# Patient Record
Sex: Female | Born: 1960 | Race: White | Hispanic: No | Marital: Single | Smoking: Never smoker
Health system: Southern US, Community
[De-identification: ages and names within clinical notes are randomized; demographics above are authoritative.]

## PROBLEM LIST (undated history)

## (undated) DIAGNOSIS — R519 Headache, unspecified: Secondary | ICD-10-CM

## (undated) DIAGNOSIS — A6000 Herpesviral infection of urogenital system, unspecified: Secondary | ICD-10-CM

## (undated) DIAGNOSIS — G2581 Restless legs syndrome: Secondary | ICD-10-CM

## (undated) DIAGNOSIS — B0089 Other herpesviral infection: Secondary | ICD-10-CM

## (undated) DIAGNOSIS — F329 Major depressive disorder, single episode, unspecified: Secondary | ICD-10-CM

## (undated) DIAGNOSIS — F32A Depression, unspecified: Secondary | ICD-10-CM

## (undated) DIAGNOSIS — I10 Essential (primary) hypertension: Secondary | ICD-10-CM

## (undated) DIAGNOSIS — N6019 Diffuse cystic mastopathy of unspecified breast: Secondary | ICD-10-CM

## (undated) DIAGNOSIS — M542 Cervicalgia: Secondary | ICD-10-CM

## (undated) DIAGNOSIS — N63 Unspecified lump in unspecified breast: Secondary | ICD-10-CM

## (undated) DIAGNOSIS — E785 Hyperlipidemia, unspecified: Secondary | ICD-10-CM

## (undated) DIAGNOSIS — G35 Multiple sclerosis: Secondary | ICD-10-CM

## (undated) DIAGNOSIS — I809 Phlebitis and thrombophlebitis of unspecified site: Secondary | ICD-10-CM

## (undated) DIAGNOSIS — J45909 Unspecified asthma, uncomplicated: Secondary | ICD-10-CM

## (undated) DIAGNOSIS — R51 Headache: Secondary | ICD-10-CM

## (undated) DIAGNOSIS — D649 Anemia, unspecified: Secondary | ICD-10-CM

## (undated) DIAGNOSIS — I2699 Other pulmonary embolism without acute cor pulmonale: Secondary | ICD-10-CM

## (undated) HISTORY — PX: REDUCTION MAMMAPLASTY: SUR839

---

## 2005-12-13 ENCOUNTER — Emergency Department: Payer: Self-pay | Admitting: Emergency Medicine

## 2006-04-24 ENCOUNTER — Emergency Department: Payer: Self-pay | Admitting: Emergency Medicine

## 2006-06-11 ENCOUNTER — Ambulatory Visit: Payer: Self-pay | Admitting: Family Medicine

## 2006-08-25 ENCOUNTER — Ambulatory Visit: Payer: Self-pay

## 2009-05-21 ENCOUNTER — Ambulatory Visit: Payer: Self-pay | Admitting: Family Medicine

## 2009-06-16 ENCOUNTER — Inpatient Hospital Stay: Payer: Self-pay | Admitting: *Deleted

## 2009-11-29 ENCOUNTER — Emergency Department: Payer: Self-pay | Admitting: Emergency Medicine

## 2010-01-14 ENCOUNTER — Ambulatory Visit: Payer: Self-pay | Admitting: Oncology

## 2010-01-26 ENCOUNTER — Inpatient Hospital Stay: Payer: Self-pay | Admitting: Internal Medicine

## 2010-01-26 ENCOUNTER — Ambulatory Visit: Payer: Self-pay | Admitting: Cardiovascular Disease

## 2010-02-13 ENCOUNTER — Ambulatory Visit: Payer: Self-pay | Admitting: Oncology

## 2010-03-05 ENCOUNTER — Ambulatory Visit: Payer: Self-pay | Admitting: Oncology

## 2010-03-16 ENCOUNTER — Ambulatory Visit: Payer: Self-pay | Admitting: Oncology

## 2010-03-20 ENCOUNTER — Ambulatory Visit: Payer: Self-pay | Admitting: Specialist

## 2010-04-15 ENCOUNTER — Ambulatory Visit: Payer: Self-pay | Admitting: Pain Medicine

## 2010-04-22 ENCOUNTER — Ambulatory Visit: Payer: Self-pay | Admitting: Pain Medicine

## 2010-04-29 ENCOUNTER — Inpatient Hospital Stay: Payer: Self-pay | Admitting: Internal Medicine

## 2010-05-12 ENCOUNTER — Ambulatory Visit: Payer: Self-pay | Admitting: Neurology

## 2010-05-18 ENCOUNTER — Ambulatory Visit: Payer: Self-pay | Admitting: Pain Medicine

## 2010-05-28 ENCOUNTER — Ambulatory Visit: Payer: Self-pay | Admitting: Pain Medicine

## 2010-06-15 ENCOUNTER — Ambulatory Visit: Payer: Self-pay | Admitting: Pain Medicine

## 2010-06-16 ENCOUNTER — Encounter: Payer: Self-pay | Admitting: Pain Medicine

## 2010-06-17 ENCOUNTER — Ambulatory Visit: Payer: Self-pay | Admitting: Pain Medicine

## 2010-06-24 ENCOUNTER — Ambulatory Visit: Payer: Self-pay | Admitting: Pain Medicine

## 2010-07-16 ENCOUNTER — Ambulatory Visit: Payer: Self-pay | Admitting: Pain Medicine

## 2010-07-16 ENCOUNTER — Inpatient Hospital Stay: Payer: Self-pay | Admitting: Internal Medicine

## 2010-07-16 ENCOUNTER — Ambulatory Visit: Payer: Self-pay | Admitting: Internal Medicine

## 2010-07-20 LAB — CEA: CEA: 1.3 ng/mL (ref 0.0–4.7)

## 2010-07-23 ENCOUNTER — Encounter: Payer: Self-pay | Admitting: Pain Medicine

## 2010-07-27 ENCOUNTER — Ambulatory Visit: Payer: Self-pay | Admitting: Pain Medicine

## 2010-08-12 ENCOUNTER — Ambulatory Visit: Payer: Self-pay | Admitting: Family Medicine

## 2010-08-16 ENCOUNTER — Ambulatory Visit: Payer: Self-pay | Admitting: Internal Medicine

## 2010-08-18 ENCOUNTER — Ambulatory Visit: Payer: Self-pay | Admitting: Pain Medicine

## 2010-08-26 ENCOUNTER — Ambulatory Visit: Payer: Self-pay | Admitting: Pain Medicine

## 2010-09-26 ENCOUNTER — Emergency Department: Payer: Self-pay | Admitting: Unknown Physician Specialty

## 2010-09-29 ENCOUNTER — Ambulatory Visit: Payer: Self-pay | Admitting: Pain Medicine

## 2010-10-07 ENCOUNTER — Ambulatory Visit: Payer: Self-pay | Admitting: Pain Medicine

## 2010-10-26 ENCOUNTER — Ambulatory Visit: Payer: Self-pay | Admitting: Pain Medicine

## 2010-11-26 ENCOUNTER — Ambulatory Visit: Payer: Self-pay | Admitting: Pain Medicine

## 2010-11-30 ENCOUNTER — Ambulatory Visit: Payer: Self-pay | Admitting: Pain Medicine

## 2010-12-31 ENCOUNTER — Ambulatory Visit: Payer: Self-pay | Admitting: Pain Medicine

## 2011-01-14 ENCOUNTER — Ambulatory Visit: Payer: Self-pay | Admitting: Pain Medicine

## 2011-01-19 ENCOUNTER — Ambulatory Visit: Payer: Self-pay | Admitting: Pain Medicine

## 2011-01-20 ENCOUNTER — Ambulatory Visit: Payer: Self-pay | Admitting: Pain Medicine

## 2011-02-02 ENCOUNTER — Ambulatory Visit: Payer: Self-pay | Admitting: Pain Medicine

## 2011-02-08 ENCOUNTER — Ambulatory Visit: Payer: Self-pay | Admitting: Pain Medicine

## 2011-03-11 ENCOUNTER — Ambulatory Visit: Payer: Self-pay | Admitting: Pain Medicine

## 2011-03-29 ENCOUNTER — Ambulatory Visit: Payer: Self-pay | Admitting: Pain Medicine

## 2011-04-13 ENCOUNTER — Ambulatory Visit: Payer: Self-pay | Admitting: Pain Medicine

## 2011-04-14 ENCOUNTER — Ambulatory Visit: Payer: Self-pay | Admitting: Pain Medicine

## 2011-05-13 ENCOUNTER — Ambulatory Visit: Payer: Self-pay | Admitting: Pain Medicine

## 2011-05-19 ENCOUNTER — Ambulatory Visit: Payer: Self-pay | Admitting: Pain Medicine

## 2011-06-10 ENCOUNTER — Ambulatory Visit: Payer: Self-pay | Admitting: Pain Medicine

## 2011-06-16 ENCOUNTER — Ambulatory Visit: Payer: Self-pay | Admitting: Pain Medicine

## 2011-07-15 ENCOUNTER — Ambulatory Visit: Payer: Self-pay | Admitting: Pain Medicine

## 2011-07-26 ENCOUNTER — Ambulatory Visit: Payer: Self-pay | Admitting: Pain Medicine

## 2011-09-09 ENCOUNTER — Ambulatory Visit: Payer: Self-pay | Admitting: Pain Medicine

## 2011-09-14 ENCOUNTER — Ambulatory Visit: Payer: Self-pay | Admitting: Family Medicine

## 2011-09-15 ENCOUNTER — Ambulatory Visit: Payer: Self-pay | Admitting: Pain Medicine

## 2011-09-16 ENCOUNTER — Ambulatory Visit: Payer: Self-pay | Admitting: Family Medicine

## 2011-10-07 ENCOUNTER — Ambulatory Visit: Payer: Self-pay | Admitting: Pain Medicine

## 2011-10-11 ENCOUNTER — Ambulatory Visit: Payer: Self-pay | Admitting: Pain Medicine

## 2011-11-04 ENCOUNTER — Ambulatory Visit: Payer: Self-pay | Admitting: Pain Medicine

## 2011-11-17 ENCOUNTER — Ambulatory Visit: Payer: Self-pay | Admitting: Pain Medicine

## 2011-12-07 ENCOUNTER — Ambulatory Visit: Payer: Self-pay | Admitting: Pain Medicine

## 2011-12-13 ENCOUNTER — Ambulatory Visit: Payer: Self-pay | Admitting: Pain Medicine

## 2012-01-04 ENCOUNTER — Ambulatory Visit: Payer: Self-pay | Admitting: Pain Medicine

## 2012-01-12 ENCOUNTER — Ambulatory Visit: Payer: Self-pay | Admitting: Pain Medicine

## 2012-02-03 ENCOUNTER — Ambulatory Visit: Payer: Self-pay | Admitting: Pain Medicine

## 2012-02-07 ENCOUNTER — Ambulatory Visit: Payer: Self-pay | Admitting: Gastroenterology

## 2012-02-08 ENCOUNTER — Ambulatory Visit: Payer: Self-pay | Admitting: Gastroenterology

## 2012-02-14 ENCOUNTER — Ambulatory Visit: Payer: Self-pay | Admitting: Pain Medicine

## 2012-02-16 ENCOUNTER — Ambulatory Visit: Payer: Self-pay | Admitting: Pain Medicine

## 2012-03-07 ENCOUNTER — Ambulatory Visit: Payer: Self-pay | Admitting: Pain Medicine

## 2012-03-20 ENCOUNTER — Ambulatory Visit: Payer: Self-pay | Admitting: Pain Medicine

## 2012-04-06 ENCOUNTER — Ambulatory Visit: Payer: Self-pay | Admitting: Pain Medicine

## 2012-04-07 ENCOUNTER — Emergency Department: Payer: Self-pay | Admitting: Emergency Medicine

## 2012-04-07 LAB — COMPREHENSIVE METABOLIC PANEL
Alkaline Phosphatase: 148 U/L — ABNORMAL HIGH (ref 50–136)
Anion Gap: 11 (ref 7–16)
BUN: 11 mg/dL (ref 7–18)
Chloride: 100 mmol/L (ref 98–107)
Creatinine: 1.08 mg/dL (ref 0.60–1.30)
EGFR (African American): >60
Glucose: 124 mg/dL — ABNORMAL HIGH (ref 65–99)
SGOT(AST): 43 U/L — ABNORMAL HIGH (ref 15–37)
SGPT (ALT): 31 U/L (ref 12–78)
Total Protein: 7.2 g/dL (ref 6.4–8.2)

## 2012-04-07 LAB — CBC WITH DIFFERENTIAL/PLATELET
Eosinophil #: 0.1 10*3/uL (ref 0.0–0.7)
Lymphocyte %: 39.9 %
MCH: 31.8 pg (ref 26.0–34.0)
MCHC: 34.5 g/dL (ref 32.0–36.0)
MCV: 92 fL (ref 80–100)
Monocyte #: 0.5 x10 3/mm (ref 0.2–0.9)
Neutrophil #: 2.5 10*3/uL (ref 1.4–6.5)
Neutrophil %: 47.1 %
Platelet: 366 10*3/uL (ref 150–440)

## 2012-04-07 LAB — APTT: Activated PTT: 42 secs — ABNORMAL HIGH (ref 23.6–35.9)

## 2012-04-07 LAB — PROTIME-INR: Prothrombin Time: 16.6 secs — ABNORMAL HIGH (ref 11.5–14.7)

## 2012-04-07 LAB — HEMOGLOBIN: HGB: 7.7 g/dL — ABNORMAL LOW (ref 12.0–16.0)

## 2012-04-26 ENCOUNTER — Ambulatory Visit: Payer: Self-pay | Admitting: Pain Medicine

## 2012-05-30 ENCOUNTER — Ambulatory Visit: Payer: Self-pay | Admitting: Pain Medicine

## 2012-05-30 ENCOUNTER — Other Ambulatory Visit: Payer: Self-pay | Admitting: Sports Medicine

## 2012-05-30 LAB — BODY FLUID CELL COUNT WITH DIFFERENTIAL
Basophil: 0 %
Eosinophil: 2 %
Lymphocytes: 26 %
Neutrophils: 17 %
Nucleated Cell Count: 778 /mm3
Other Cells BF: 0 %
Other Mononuclear Cells: 55 %

## 2012-05-30 LAB — SYNOVIAL FLUID, CRYSTAL: Crystals, Joint Fluid: NONE SEEN

## 2012-06-03 LAB — BODY FLUID CULTURE

## 2012-06-07 ENCOUNTER — Ambulatory Visit: Payer: Self-pay | Admitting: Pain Medicine

## 2012-07-06 ENCOUNTER — Ambulatory Visit: Payer: Self-pay | Admitting: Pain Medicine

## 2012-07-17 ENCOUNTER — Ambulatory Visit: Payer: Self-pay | Admitting: Pain Medicine

## 2012-07-25 ENCOUNTER — Emergency Department: Payer: Self-pay | Admitting: Emergency Medicine

## 2012-08-03 ENCOUNTER — Ambulatory Visit: Payer: Self-pay | Admitting: Pain Medicine

## 2012-08-21 ENCOUNTER — Ambulatory Visit: Payer: Self-pay | Admitting: Pain Medicine

## 2012-08-31 ENCOUNTER — Ambulatory Visit: Payer: Self-pay | Admitting: Pain Medicine

## 2012-09-11 ENCOUNTER — Ambulatory Visit: Payer: Self-pay | Admitting: Pain Medicine

## 2012-09-14 ENCOUNTER — Ambulatory Visit: Payer: Self-pay | Admitting: Obstetrics and Gynecology

## 2012-10-03 ENCOUNTER — Ambulatory Visit: Payer: Self-pay | Admitting: Pain Medicine

## 2012-10-09 ENCOUNTER — Ambulatory Visit: Payer: Self-pay | Admitting: Pain Medicine

## 2012-10-18 ENCOUNTER — Ambulatory Visit: Payer: Self-pay | Admitting: Pain Medicine

## 2012-10-31 ENCOUNTER — Ambulatory Visit: Payer: Self-pay | Admitting: Pain Medicine

## 2012-11-08 ENCOUNTER — Ambulatory Visit: Payer: Self-pay | Admitting: Pain Medicine

## 2012-11-22 ENCOUNTER — Ambulatory Visit: Payer: Self-pay | Admitting: Pain Medicine

## 2012-12-06 ENCOUNTER — Ambulatory Visit: Payer: Self-pay | Admitting: Pain Medicine

## 2012-12-28 ENCOUNTER — Ambulatory Visit: Payer: Self-pay | Admitting: Pain Medicine

## 2012-12-30 ENCOUNTER — Ambulatory Visit: Payer: Self-pay | Admitting: Neurology

## 2012-12-30 LAB — CREATININE, SERUM
Creatinine: 1.16 mg/dL (ref 0.60–1.30)
EGFR (African American): >60
EGFR (Non-African Amer.): 54 — ABNORMAL LOW

## 2013-01-10 ENCOUNTER — Ambulatory Visit: Payer: Self-pay | Admitting: Neurology

## 2013-01-10 ENCOUNTER — Inpatient Hospital Stay: Payer: Self-pay | Admitting: Family Medicine

## 2013-01-10 ENCOUNTER — Ambulatory Visit: Payer: Self-pay | Admitting: Pain Medicine

## 2013-01-10 LAB — BASIC METABOLIC PANEL
Calcium, Total: 9.2 mg/dL (ref 8.5–10.1)
Creatinine: 1.05 mg/dL (ref 0.60–1.30)
EGFR (Non-African Amer.): >60
Osmolality: 274 (ref 275–301)
Potassium: 4 mmol/L (ref 3.5–5.1)

## 2013-01-10 LAB — URINALYSIS, COMPLETE
Bacteria: NONE SEEN
Bilirubin,UR: NEGATIVE
Ketone: NEGATIVE
Nitrite: NEGATIVE
Ph: 5 (ref 4.5–8.0)
Protein: NEGATIVE
RBC,UR: 5 /HPF (ref 0–5)
Specific Gravity: 1.018 (ref 1.003–1.030)

## 2013-01-10 LAB — CBC
HCT: 40.1 % (ref 35.0–47.0)
HGB: 13.8 g/dL (ref 12.0–16.0)
MCH: 32.8 pg (ref 26.0–34.0)
MCV: 95 fL (ref 80–100)
Platelet: 246 10*3/uL (ref 150–440)
RBC: 4.21 10*6/uL (ref 3.80–5.20)
RDW: 13.7 % (ref 11.5–14.5)
WBC: 6.8 10*3/uL (ref 3.6–11.0)

## 2013-01-10 LAB — PROTIME-INR: INR: 2.9

## 2013-01-24 ENCOUNTER — Ambulatory Visit: Payer: Self-pay | Admitting: Pain Medicine

## 2013-02-05 ENCOUNTER — Ambulatory Visit: Payer: Self-pay | Admitting: Pain Medicine

## 2013-02-15 ENCOUNTER — Ambulatory Visit: Payer: Self-pay | Admitting: Pain Medicine

## 2013-02-21 ENCOUNTER — Ambulatory Visit: Payer: Self-pay | Admitting: Pain Medicine

## 2013-03-29 ENCOUNTER — Ambulatory Visit: Payer: Self-pay | Admitting: Pain Medicine

## 2013-04-06 ENCOUNTER — Emergency Department: Payer: Self-pay | Admitting: Emergency Medicine

## 2013-04-06 LAB — URINALYSIS, COMPLETE
Bilirubin,UR: NEGATIVE
Blood: NEGATIVE
Glucose,UR: NEGATIVE mg/dL (ref 0–75)
Nitrite: NEGATIVE
Specific Gravity: 1.02 (ref 1.003–1.030)
Squamous Epithelial: 16
WBC UR: 12 /HPF (ref 0–5)

## 2013-04-06 LAB — CBC
HCT: 45.5 % (ref 35.0–47.0)
MCH: 33.4 pg (ref 26.0–34.0)
MCV: 97 fL (ref 80–100)
Platelet: 243 10*3/uL (ref 150–440)
RDW: 13.8 % (ref 11.5–14.5)

## 2013-04-06 LAB — BASIC METABOLIC PANEL
Anion Gap: 4 — ABNORMAL LOW (ref 7–16)
Chloride: 103 mmol/L (ref 98–107)
Co2: 30 mmol/L (ref 21–32)
Creatinine: 1.11 mg/dL (ref 0.60–1.30)
EGFR (African American): >60
Osmolality: 274 (ref 275–301)
Potassium: 3.5 mmol/L (ref 3.5–5.1)

## 2013-04-06 LAB — TROPONIN I: Troponin-I: <0.02 ng/mL

## 2013-04-11 ENCOUNTER — Emergency Department: Payer: Self-pay | Admitting: Internal Medicine

## 2013-04-18 ENCOUNTER — Ambulatory Visit: Payer: Self-pay | Admitting: Pain Medicine

## 2013-04-30 ENCOUNTER — Ambulatory Visit: Payer: Self-pay | Admitting: Pain Medicine

## 2013-05-29 ENCOUNTER — Ambulatory Visit: Payer: Self-pay | Admitting: Pain Medicine

## 2013-06-29 ENCOUNTER — Emergency Department: Payer: Self-pay | Admitting: Emergency Medicine

## 2013-06-29 LAB — CBC
HCT: 37.8 % (ref 35.0–47.0)
HGB: 12.8 g/dL (ref 12.0–16.0)
MCHC: 34 g/dL (ref 32.0–36.0)
MCV: 95 fL (ref 80–100)
RBC: 3.96 10*6/uL (ref 3.80–5.20)
WBC: 8.8 10*3/uL (ref 3.6–11.0)

## 2013-06-29 LAB — COMPREHENSIVE METABOLIC PANEL
Albumin: 3.7 g/dL (ref 3.4–5.0)
Anion Gap: 3 — ABNORMAL LOW (ref 7–16)
BUN: 9 mg/dL (ref 7–18)
Bilirubin,Total: 0.4 mg/dL (ref 0.2–1.0)
Calcium, Total: 9 mg/dL (ref 8.5–10.1)
Co2: 30 mmol/L (ref 21–32)
Glucose: 83 mg/dL (ref 65–99)
Osmolality: 275 (ref 275–301)
Potassium: 4.1 mmol/L (ref 3.5–5.1)
SGOT(AST): 44 U/L — ABNORMAL HIGH (ref 15–37)
Total Protein: 6.7 g/dL (ref 6.4–8.2)

## 2013-06-29 LAB — URINALYSIS, COMPLETE
Bilirubin,UR: NEGATIVE
Blood: NEGATIVE
Glucose,UR: NEGATIVE mg/dL (ref 0–75)
Ketone: NEGATIVE
Nitrite: NEGATIVE
Ph: 5 (ref 4.5–8.0)

## 2013-06-29 LAB — PROTIME-INR: INR: 1.6

## 2013-07-31 ENCOUNTER — Ambulatory Visit: Payer: Self-pay | Admitting: Psychiatry

## 2013-10-30 ENCOUNTER — Ambulatory Visit: Payer: Self-pay | Admitting: Obstetrics and Gynecology

## 2014-01-08 ENCOUNTER — Ambulatory Visit: Payer: Self-pay | Admitting: Pain Medicine

## 2014-01-13 ENCOUNTER — Emergency Department: Payer: Self-pay | Admitting: Emergency Medicine

## 2014-02-18 ENCOUNTER — Encounter: Payer: Self-pay | Admitting: Anesthesiology

## 2014-03-16 ENCOUNTER — Encounter: Payer: Self-pay | Admitting: Anesthesiology

## 2014-11-01 ENCOUNTER — Ambulatory Visit: Payer: Self-pay | Admitting: Obstetrics and Gynecology

## 2014-11-29 ENCOUNTER — Other Ambulatory Visit: Payer: Self-pay | Admitting: Obstetrics and Gynecology

## 2014-11-29 DIAGNOSIS — N63 Unspecified lump in unspecified breast: Secondary | ICD-10-CM

## 2014-12-03 ENCOUNTER — Ambulatory Visit
Admit: 2014-12-03 | Disposition: A | Discharge status: Home / Self Care | Payer: Self-pay | Attending: Otolaryngology | Admitting: Otolaryngology

## 2014-12-04 ENCOUNTER — Emergency Department: Admit: 2014-12-04 | Discharge status: Against Medical Advice | Payer: Self-pay | Admitting: Student

## 2014-12-06 NOTE — H&P (Signed)
PATIENT NAME:  Bailey Graham MR#:  [id number] DATE OF BIRTH:  10-05-1960  DATE OF ADMISSION:  01/10/2013 DATE OF DISCHARGE: 01/10/2013   NOTE: This is both a history and physical as well as discharge summary, as the patient is being admitted and discharged the same day.  HISTORY AND PHYSICAL  CHIEF COMPLAINT: Status post fall and weakness.   HISTORY OF PRESENT ILLNESS: Ms. Bailey Graham is a very nice 54 year old female who has history of being her normal self up until yesterday at night. Around 8:00 p.m., she got out of bed, going to the kitchen to find something to eat and fell. She states that she just felt her legs give out, left more than right, and she fell down and hit her head on the back of her occiput. Her son heard a noise and he came rapidly and found her on the floor. He picked her up and put her in bed. He wanted to bring her to the ER, but the patient refused to come to the ER and decided to wait until this morning. This morning, the patient went to the pain clinic. The pain clinic called Dr. Sherryll Graham. Dr. Sherryll Graham said bring her to the ER because she had a exacerbation of her MS and he wanted to get it treated with IV steroids for 3 days. From the ER, we called Dr. Sherryll Graham and he went to do the steroids IV as mentioned above. The patient has a history of falls that she has been having at least twice a week. The patient has been evaluated by neurology for this and she actually had an MRI done last Saturday, the 17th, outpatient and it did not show any major abnormalities. There are white matter signal abnormalities that reflect demyelination process, sequelae of small vessel ischemia, no change since prior MRI. The patient is admitted for treatment of this possible exacerbation of MS.   REVIEW OF SYSTEMS: CONSTITUTIONAL: The patient states that she has chronic fatigue, and she has what she called exacerbations of her MS on a weekly basis. She has not been in the hospital for this in a long  time.  EYES: No blurry vision, double vision or inflammation.  EARS, NOSE, THROAT: No tinnitus. No difficulty swallowing. No sinus pain. RESPIRATORY:  The patient states she does not have any shortness of breath, hemoptysis, wheezing or painful respirations.  CARDIOVASCULAR: Denies any significant chest pain, orthopnea, syncope or palpitations.  GASTROINTESTINAL: No nausea, vomiting, abdominal pain, rectal bleeding, constipation or diarrhea.  GENITOURINARY: No dysuria, hematuria or incontinence.  GYNECOLOGIC: No breast masses.  HEMATOLOGIC AND LYMPHATIC: No anemia. Positive easy bruising, as the patient is on Coumadin. No significant bleeding. No swollen glands. The patient has factor V Leiden mutation.  ENDOCRINE: No polyuria, polydipsia or polyphagia. No cold or heat intolerance.  SKIN: No rashes or petechiae. No new moles.  MUSCULOSKELETAL: Positive chronic pain, back pain, joint pain and fibromyalgia. No gout.  NEUROLOGIC: No previous CVAs or TIAs. Positive multiple sclerosis.  PSYCHIATRIC: Positive mild depression, well controlled.   PAST MEDICAL HISTORY:  1.  History on PE, on chronic anticoagulation with Coumadin.  2.  Factor V Leiden deficiency.  3.  Migraines.  4.  Asthma.  5.  Hypertension.  6.  Fibromyalgia.  7.  History of shingles in the past.  8.  Multiple sclerosis diagnosed in 1994.   PAST SURGICAL HISTORY: 1.  C-section x 2. 2.  Breast reduction.  3.  Cyst removal of her breasts.  4.  Left knee arthroscopic surgery x 2.  5.  Right foot bunion surgery/bunionectomy.  6.  Hysterectomy.   SOCIAL HISTORY: The patient does not drink, does not smoke. She lives with her 7 year old. Her daughter moved from home to go to [place] for school. She is not married.   ALLERGIES: THE PATIENT IS ALLERGIC TO AMOXICILLIN AND SULFAS. SHE STATES THAT THESE MEDICATIONS MAKE HER HAVE C. DIFFICILE.   FAMILY HISTORY: Positive for V Leiden mutation in her father who had massive pulmonary  embolisms. No history of cancer or hypertension in her family.   CURRENT MEDICATIONS: Valtrex 500 mg once a day, Singulair 10 mg once daily, OxyContin 10 mg rapid-release 4 times a day, MiraLAX 17 grams once a day, metoprolol 50 mg once a day extended-release, Lipitor 40 mg once a day, Imitrex 100 mg once a day, Flexeril 10 mg 2 times a day as needed, fentanyl 25 mcg transdermal every 72 hours; Coumadin 5 mg once a day on Tuesday, Thursday, Saturday and Sunday and 2.5 mg the rest of the days of the week; clonazepam 2 mg take 2 tablets as needed p.r.n., Antivert 25 mg 1 tablet every 6 hours prescribed today, alprazolam 1 mg tablet p.r.n.   PHYSICAL EXAMINATION: VITAL SIGNS: Blood pressure 144/94, pulse 65, respirations 16, temperature 97.3, pulse oximetry 100%. GENERAL: Alert, oriented x 3. No acute distress. No respiratory distress. Hemodynamically stable.  HEENT: Pupils are equal and reactive. Extraocular movements are intact. Mucosae are moist. Anicteric sclerae. Pink conjunctivae. No ptosis. No oral lesions. No oropharyngeal exudates. Tongue is central.  NECK: Supple. No JVD. No thyromegaly. No adenopathy. No carotid bruits. No rigidity.  CARDIOVASCULAR: Regular rate and rhythm. No murmurs, rubs or gallops are appreciated. No displacement of PMI.  LUNGS: Clear without any wheezing or crepitus. No use of accessory muscles.  ABDOMEN: Soft, nontender, nondistended. No hepatosplenomegaly. No masses. Bowel sounds are positive.  EXTREMITIES: No edema, cyanosis or clubbing. Pulses +2. Capillary refill less than 3.  NEUROLOGIC: Strength is 4/5 in all 4 extremities. No lateralization of symptoms. Sensation is normal in all 4 extremities. Apparently as per the Child psychotherapist, she had some numbness of the left lower extremity, but the patient did not give me that impression. She says that she did not have any tingling in any way in any place or numbness in any place. Neurologic again also checked by Dr.  Loretha Graham of neurology and he did not think there was anything abnormal. He thought that her left lower extremity was 4/5 strength, but it is chronic. Cerebellar tests are normal, finger-to-nose and rapid alternating movements.  PSYCHIATRIC: Mood is normal without any signs of depression or anxiety.  LYMPHATIC: Negative for lymphadenopathy in neck or supraclavicular areas.  MUSCULOSKELETAL: No significant joint deformity or joint swelling.   LABORATORY AND RADIOLOGICAL DATA: MRI of the brain mentioned above. Glucose is 85, creatinine 1.05, electrolytes within normal limits. White count is 6.8, hemoglobin 13, platelets 246. INR 2.9. Urinalysis: No signs of UTI. CT of the head done after a fall, as the patient is on Coumadin, did not show any significant bleeding or other abnormalities.   ASSESSMENT AND PLAN: A 54 year old female with a history of multiple sclerosis admitted for a possible flare-up.  1.  As I mentioned, the patient has been admitted with a history of weakness and falling. She had a fall yesterday, hit her head and decided to come to pain clinic this morning. From the pain clinic, she was sent here to the  Emergency Room. CT scan of the head did not show any bleeding. As the patient is on Coumadin, that was done as a protocol. She did have an MRI previously and did not have any major abnormalities other than her chronic white matter disease. The patient was admitted with orders to have methylprednisolone 500 mg for 3 days and then taper with oral steroids. Orders were put on as an admission, as the professional consultation with Dr. Sherryll Graham indicated that.  2.  As far as her other problems, for her pulmonary embolism, the patient had a therapeutic INR . The patient is to continue to take Coumadin.  3.  History of fibromyalgia: Pain control.  4.  Deep vein thrombosis prophylaxis: Not needed, as the patient was fully anticoagulated.   CODE STATUS: The patient is admitted full code.   TIME  SPENT: I spent about 45 minutes with this patient on admission.   DISCHARGE SUMMARY  The patient was admitted with a history of possible MS exacerbation. The patient was seen by Dr. Loretha Graham, who is one of our neurologists. In his plan, he thought that this was not a convincing MS (or multiple sclerosis) exacerbation. The patient was not on any medications related to that and he doubted that this would be anything that would benefit from steroids. He did several testing for vertigo and he determined that the patient has been benign positional vertigo, for what he recommended Antivert. The patient is discharged on Antivert today as per recommendation of neurology. He commented the case with Dr. Sherryll Graham and the patient is going to be discharged today with followup with Dr. Sherryll Graham.  MEDICATIONS AT DISCHARGE: Same as medications in H and P.   TIME SPENT: I spent about 35 minutes discharging this patient and commenting the case with neurology.   ____________________________ Felipa Furnace, MD [email or web address] D: 01/10/2013 18:42:14 ET T: 01/10/2013 21:01:34 ET JOB#: [id number]  cc: Felipa Furnace, MD, <Dictator> Aleece Loyd Juanda Chance MD ELECTRONICALLY SIGNED 01/11/2013 21:32

## 2014-12-06 NOTE — H&P (Signed)
PATIENT NAME:  Bailey Graham, Bailey Graham MR#:  [id number] DATE OF BIRTH:  March 01, 1961  DATE OF ADMISSION:  01/10/2013  ADDENDUM: This is a continuation.   REASON FOR CONSULTATION: Rule out multiple sclerosis exacerbation.   HISTORY OF PRESENT ILLNESS: Again, this is a 54 year old female with past medical history of pulmonary embolism, status post Coumadin initiation in 2009, status post history of multiple sclerosis diagnosed in 1994, being treated by Dr. Cristopher Peru of [facility]. The patient is status post visit with Dr. Sherryll Burger on May 19th of this year. She is status post EMG and nerve conduction studies. The reason for the EMG and nerve conduction studies for the past few years has been progressive lower extremity numbness, especially in the soles of her feet. The patient is status post last MRI this Saturday. Reason for coming to the hospital is she is status post fall. The patient states that symptoms started yesterday. The patient got up from the bedroom. She was walking to the kitchen and she felt very unsteady on her feet, at which time she fell and needed assistance from her son to get up from the floor and assistance to get back to the bed. She woke up this morning, was able to stand on her own, actually [facility]. In the [facility] Center, she appeared to be ataxic. She did not complain of weakness but  felt like the room was spinning around her. The patient states she has on-and-off symptoms in the past. The last similar episode 10 days ago was status post fall. No new weakness. No new sensory deficits. Post fall, she did have difficulty standing up. No urinary incontinence. No seizure-like activity. No tongue biting. The patient does state she has increased falls in the past 2 months, and the reason for that is decreased sensation in her lower extremities.   LABORATORY AND RADIOLOGICAL DATA: The patient had a CAT scan of the head that did not show any acute  abnormalities. Glucose 85, BUN 10, creatinine 1.05, sodium 138, potassium 4.0, chloride 104. White blood cell count is 6.8, hemoglobin 13.8, hematocrit 40. INR is 2.9. The patient is on Coumadin.   REVIEW OF SYSTEMS:  RESPIRATORY: No shortness of breath.  CARDIOVASCULAR: No chest pain.  GASTROINTESTINAL: No abdominal pain. Mild nausea and vomiting with change in position.  NEUROLOGIC: No new sensory abnormalities.  PSYCHIATRIC: No history of anxiety. No depression.   SOCIAL HISTORY: Lives with her son. Denies any smoking, alcohol or drug use.   FAMILY HISTORY: History of diabetes.   PHYSICAL EXAMINATION: VITAL SIGNS: Include a temperature of 97.3, pulse 65, blood pressure 144/94, pulse oximetry 100%.  NEUROLOGICAL EVALUATION: Extraocular movements appear to be intact. No signs of INO found. Pupils 4 mm to 2 mm, reactive symmetrically. Visual fields intact bilaterally. Facial sensation and facial motor intact. Tongue is midline. Uvula elevates symmetrically. Shoulder shrug intact. Motor strength is 5-/5 bilateral upper extremities, 5-/5 right lower extremity and 4/5 left lower extremity which is chronic in nature. Decreased sensation to light touch in lower extremities, left worse than right. Reflexes diminished symmetrically. Coordination: Finger-to-nose intact bilaterally. Gait not assessed.  IMAGING: CAT scan as above, no acute intracranial pathology.   LABORATORY DATA: As above as well.   IMPRESSION: A 54 year old female with past medical history of pulmonary embolism, multiple sclerosis, not on any medication for multiple sclerosis treatment, presenting with a fall, ataxia. On neurological examination, the patient states that she has increased vertigo when she changes  position from sitting to standing and from lying to sitting, worse when turning her head to the left or to the right. I did Dix-Hallpike maneuver, which is positive specifically on the right side with nystagmus right to left.    PLAN: At this point, I am not convinced this is an MS exacerbation. The patient has not had any multiple sclerosis exacerbation in the past. She has not had any steroids in the past. I believe this is benign positional vertigo. She is status post meclizine, which improved her symptoms. At this point, I would continue hydration. Continue meclizine or benzodiazepine for benign positional vertigo. PT, OT for Epley maneuvers. The patient is status post MRI of the brain this past weekend. When vertigo slightly improves and the patient feels safe to be discharged, I believe she can be discharged with meclizine or benzodiazepine as outpatient. She should follow up with Dr. Sherryll Burger as outpatient. Again, I do not believe this is an MS exacerbation. Would not start Solu-Medrol at this point.   Thank you, it was a pleasure seeing this patient.    ____________________________ Pauletta Browns, MD [email or web address] D: 01/10/2013 15:48:26 ET T: 01/10/2013 17:05:37 ET JOB#: [id number]  cc: Pauletta Browns, MD, <Dictator>

## 2014-12-06 NOTE — Consult Note (Signed)
PATIENT NAME:  Bailey Graham, HENJUM MR#:  [id number] DATE OF BIRTH:  1961/01/08  DATE OF CONSULTATION:  01/10/2013  CONSULTING PHYSICIAN:  Pauletta Browns, MD  REASON FOR EVALUATION: Questionable multiple sclerosis exacerbation.   HISTORY OF PRESENT ILLNESS: This is a pleasant 54 year old female with multiple complaints such as pulmonary embolism history, is on Coumadin since 2009, diagnosed with multiple sclerosis in 1994.  The patient has mild multiple sclerosis, slow progression.  The patient has been seeing Dr. Sherryll Burger, last MRI about a week ago. The patient has seen Dr. Sherryll Burger on the 19th of this month status post EMG and nerve conduction studies. The patient presented today post fall. The patient states yesterday she was ambulating from her bedroom to the kitchen at which point she felt unsteady, and when she got to the kitchen. She needed assistance on getting up from the seated position, and she was assisted by her son.  Dictation ends here. Please see addendum for continuation of dictation.    ____________________________ Pauletta Browns, MD yz:cb D: 01/10/2013 15:37:00 ET T: 01/10/2013 16:12:06 ET JOB#: [id number]  cc: Pauletta Browns, MD, <Dictator> Pauletta Browns MD ELECTRONICALLY SIGNED 01/12/2013 14:39

## 2014-12-06 NOTE — Consult Note (Signed)
PATIENT NAME:  Bailey Graham MR#:  [id number] DATE OF BIRTH:  11-Oct-1960  DATE OF CONSULTATION:  01/10/2013  REFERRING PHYSICIAN:  Felipa Furnace, MD CONSULTING PHYSICIAN:  Pauletta Browns, MD  ADDENDUM: This is a continuation.   REASON FOR CONSULTATION: Rule out multiple sclerosis exacerbation.   HISTORY OF PRESENT ILLNESS: Again, this is a 54 year old female with past medical history of pulmonary embolism, status post Coumadin initiation in 2009, status post history of multiple sclerosis diagnosed in 1994, being treated by Dr. Cristopher Peru of [facility]. The patient is status post visit with Dr. Sherryll Burger on May 19th of this year. She is status post EMG and nerve conduction studies. The reason for the EMG and nerve conduction studies for the past few years has been progressive lower extremity numbness, especially in the soles of her feet. The patient is status post last MRI this Saturday. Reason for coming to the hospital is she is status post fall. The patient states that symptoms started yesterday. The patient got up from the bedroom. She was walking to the kitchen and she felt very unsteady on her feet, at which time she fell and needed assistance from her son to get up from the floor and assistance to get back to the bed. She woke up this morning, was able to stand on her own, actually [facility]. In the [facility] Center, she appeared to be ataxic. She did not complain of weakness but  felt like the room was spinning around her. The patient states she has on-and-off symptoms in the past. The last similar episode 10 days ago was status post fall. No new weakness. No new sensory deficits. Post fall, she did have difficulty standing up. No urinary incontinence. No seizure-like activity. No tongue biting. The patient does state she has increased falls in the past 2 months, and the reason for that is decreased sensation in her lower extremities.    LABORATORY AND RADIOLOGICAL DATA: The patient had a CAT scan of the head that did not show any acute abnormalities. Glucose 85, BUN 10, creatinine 1.05, sodium 138, potassium 4.0, chloride 104. White blood cell count is 6.8, hemoglobin 13.8, hematocrit 40. INR is 2.9. The patient is on Coumadin.   REVIEW OF SYSTEMS:  RESPIRATORY: No shortness of breath.  CARDIOVASCULAR: No chest pain.  GASTROINTESTINAL: No abdominal pain. Mild nausea and vomiting with change in position.  NEUROLOGIC: No new sensory abnormalities.  PSYCHIATRIC: No history of anxiety. No depression.   SOCIAL HISTORY: Lives with her son. Denies any smoking, alcohol or drug use.   FAMILY HISTORY: History of diabetes.   PHYSICAL EXAMINATION: VITAL SIGNS: Include a temperature of 97.3, pulse 65, blood pressure 144/94, pulse oximetry 100%.   NEUROLOGICAL EVALUATION: Extraocular movements appear to be intact. No signs of INO found. Pupils 4 mm to 2 mm, reactive symmetrically. Visual fields intact bilaterally. Facial sensation and facial motor intact. Tongue is midline. Uvula elevates symmetrically. Shoulder shrug intact. Motor strength is 5-/5 bilateral upper extremities, 5-/5 right lower extremity and 4/5 left lower extremity which is chronic in nature. Decreased sensation to light touch in lower extremities, left worse than right. Reflexes diminished symmetrically. Coordination: Finger-to-nose intact bilaterally. Gait not assessed.  IMAGING: CAT scan as above, no acute intracranial pathology.   LABORATORY DATA: As above as well.   IMPRESSION: A 54 year old female with past medical history of pulmonary embolism, multiple sclerosis, not on any medication for multiple sclerosis treatment, presenting with a fall,  ataxia. On neurological examination, the patient states that she has increased vertigo when she changes position from sitting to standing and from lying to sitting, worse when turning her head to the left or to the right. I  did Dix-Hallpike maneuver, which is positive specifically on the right side with nystagmus right to left.   PLAN: At this point, I am not convinced this is an MS exacerbation. The patient has not had any multiple sclerosis exacerbation in the past. She has not had any steroids in the past. I believe this is benign positional vertigo. She is status post meclizine, which improved her symptoms. At this point, I would continue hydration. Continue meclizine or benzodiazepine for benign positional vertigo. PT, OT for Epley maneuvers. The patient is status post MRI of the brain this past weekend. When vertigo slightly improves and the patient feels safe to be discharged, I believe she can be discharged with meclizine or benzodiazepine as outpatient. She should follow up with Dr. Sherryll Burger as outpatient. Again, I do not believe this is an MS exacerbation. Would not start Solu-Medrol at this point.   Thank you, it was a pleasure seeing this patient.     ____________________________ Pauletta Browns, MD [email or web address]:jm D: 01/10/2013 15:48:00 ET T: 01/10/2013 17:05:37 ET JOB#: [id number]  cc: Pauletta Browns, MD, <Dictator>   Pauletta Browns MD ELECTRONICALLY SIGNED 01/12/2013 14:39

## 2014-12-11 ENCOUNTER — Ambulatory Visit
Admit: 2014-12-11 | Disposition: A | Discharge status: Home / Self Care | Payer: Self-pay | Attending: Otolaryngology | Admitting: Otolaryngology

## 2015-05-05 ENCOUNTER — Other Ambulatory Visit: Payer: Self-pay

## 2015-05-07 ENCOUNTER — Other Ambulatory Visit: Payer: Self-pay | Admitting: Obstetrics and Gynecology

## 2015-05-07 ENCOUNTER — Ambulatory Visit
Admission: RE | Admit: 2015-05-07 | Discharge: 2015-05-07 | Disposition: A | Discharge status: Home / Self Care | Payer: Medicare Other | Source: Ambulatory Visit | Attending: Obstetrics and Gynecology | Admitting: Obstetrics and Gynecology

## 2015-05-07 DIAGNOSIS — N63 Unspecified lump in unspecified breast: Secondary | ICD-10-CM

## 2015-05-09 ENCOUNTER — Other Ambulatory Visit: Payer: Self-pay | Admitting: Neurology

## 2015-05-09 DIAGNOSIS — Z8669 Personal history of other diseases of the nervous system and sense organs: Principal | ICD-10-CM

## 2015-05-09 DIAGNOSIS — G35 Multiple sclerosis: Secondary | ICD-10-CM

## 2015-05-09 DIAGNOSIS — R9389 Abnormal findings on diagnostic imaging of other specified body structures: Secondary | ICD-10-CM

## 2015-05-19 ENCOUNTER — Ambulatory Visit
Admission: RE | Admit: 2015-05-19 | Discharge: 2015-05-19 | Disposition: A | Discharge status: Home / Self Care | Payer: Medicare Other | Source: Ambulatory Visit | Attending: Neurology | Admitting: Neurology

## 2015-05-19 DIAGNOSIS — R938 Abnormal findings on diagnostic imaging of other specified body structures: Secondary | ICD-10-CM | POA: Diagnosis not present

## 2015-05-19 DIAGNOSIS — Z8669 Personal history of other diseases of the nervous system and sense organs: Secondary | ICD-10-CM | POA: Insufficient documentation

## 2015-05-19 DIAGNOSIS — G35 Multiple sclerosis: Secondary | ICD-10-CM

## 2015-05-19 DIAGNOSIS — R9389 Abnormal findings on diagnostic imaging of other specified body structures: Secondary | ICD-10-CM

## 2015-05-19 MED ORDER — GADOBENATE DIMEGLUMINE 529 MG/ML IV SOLN
15.0000 mL | Freq: Once | INTRAVENOUS | Status: AC | PRN
  Administered 2015-05-19: 14 mL via INTRAVENOUS

## 2015-05-27 ENCOUNTER — Other Ambulatory Visit: Payer: Self-pay | Admitting: Obstetrics and Gynecology

## 2015-05-27 DIAGNOSIS — N6310 Unspecified lump in the right breast, unspecified quadrant: Secondary | ICD-10-CM

## 2015-07-17 ENCOUNTER — Other Ambulatory Visit: Payer: Self-pay | Admitting: Otolaryngology

## 2015-07-17 DIAGNOSIS — E041 Nontoxic single thyroid nodule: Secondary | ICD-10-CM

## 2015-09-26 ENCOUNTER — Encounter: Payer: Self-pay | Admitting: *Deleted

## 2015-09-29 ENCOUNTER — Encounter: Payer: Self-pay | Admitting: *Deleted

## 2015-09-29 ENCOUNTER — Encounter
Admission: RE | Disposition: A | Discharge status: Home / Self Care | Payer: Self-pay | Source: Ambulatory Visit | Attending: Gastroenterology

## 2015-09-29 ENCOUNTER — Ambulatory Visit: Payer: Medicare Other | Admitting: Certified Registered Nurse Anesthetist

## 2015-09-29 ENCOUNTER — Ambulatory Visit
Admission: RE | Admit: 2015-09-29 | Discharge: 2015-09-29 | Disposition: A | Discharge status: Home / Self Care | Payer: Medicare Other | Source: Ambulatory Visit | Attending: Gastroenterology | Admitting: Gastroenterology

## 2015-09-29 DIAGNOSIS — R1013 Epigastric pain: Secondary | ICD-10-CM | POA: Diagnosis present

## 2015-09-29 DIAGNOSIS — G35 Multiple sclerosis: Secondary | ICD-10-CM | POA: Insufficient documentation

## 2015-09-29 DIAGNOSIS — F329 Major depressive disorder, single episode, unspecified: Secondary | ICD-10-CM | POA: Insufficient documentation

## 2015-09-29 DIAGNOSIS — Z79899 Other long term (current) drug therapy: Secondary | ICD-10-CM | POA: Diagnosis not present

## 2015-09-29 DIAGNOSIS — E785 Hyperlipidemia, unspecified: Secondary | ICD-10-CM | POA: Diagnosis not present

## 2015-09-29 DIAGNOSIS — Z7951 Long term (current) use of inhaled steroids: Secondary | ICD-10-CM | POA: Diagnosis not present

## 2015-09-29 DIAGNOSIS — Z86711 Personal history of pulmonary embolism: Secondary | ICD-10-CM | POA: Diagnosis not present

## 2015-09-29 DIAGNOSIS — K2950 Unspecified chronic gastritis without bleeding: Secondary | ICD-10-CM | POA: Insufficient documentation

## 2015-09-29 DIAGNOSIS — G2581 Restless legs syndrome: Secondary | ICD-10-CM | POA: Diagnosis not present

## 2015-09-29 DIAGNOSIS — J45909 Unspecified asthma, uncomplicated: Secondary | ICD-10-CM | POA: Diagnosis not present

## 2015-09-29 DIAGNOSIS — K5900 Constipation, unspecified: Secondary | ICD-10-CM | POA: Insufficient documentation

## 2015-09-29 DIAGNOSIS — I10 Essential (primary) hypertension: Secondary | ICD-10-CM | POA: Insufficient documentation

## 2015-09-29 DIAGNOSIS — F419 Anxiety disorder, unspecified: Secondary | ICD-10-CM | POA: Insufficient documentation

## 2015-09-29 DIAGNOSIS — K219 Gastro-esophageal reflux disease without esophagitis: Secondary | ICD-10-CM | POA: Insufficient documentation

## 2015-09-29 DIAGNOSIS — G43919 Migraine, unspecified, intractable, without status migrainosus: Secondary | ICD-10-CM | POA: Insufficient documentation

## 2015-09-29 HISTORY — DX: Cervicalgia: M54.2

## 2015-09-29 HISTORY — DX: Essential (primary) hypertension: I10

## 2015-09-29 HISTORY — DX: Headache, unspecified: R51.9

## 2015-09-29 HISTORY — DX: Diffuse cystic mastopathy of unspecified breast: N60.19

## 2015-09-29 HISTORY — DX: Unspecified lump in unspecified breast: N63.0

## 2015-09-29 HISTORY — DX: Headache: R51

## 2015-09-29 HISTORY — DX: Anemia, unspecified: D64.9

## 2015-09-29 HISTORY — DX: Multiple sclerosis: G35

## 2015-09-29 HISTORY — DX: Restless legs syndrome: G25.81

## 2015-09-29 HISTORY — DX: Other pulmonary embolism without acute cor pulmonale: I26.99

## 2015-09-29 HISTORY — DX: Other herpesviral infection: B00.89

## 2015-09-29 HISTORY — DX: Depression, unspecified: F32.A

## 2015-09-29 HISTORY — DX: Major depressive disorder, single episode, unspecified: F32.9

## 2015-09-29 HISTORY — DX: Hyperlipidemia, unspecified: E78.5

## 2015-09-29 HISTORY — DX: Unspecified asthma, uncomplicated: J45.909

## 2015-09-29 HISTORY — DX: Herpesviral infection of urogenital system, unspecified: A60.00

## 2015-09-29 HISTORY — DX: Phlebitis and thrombophlebitis of unspecified site: I80.9

## 2015-09-29 HISTORY — PX: ESOPHAGOGASTRODUODENOSCOPY (EGD) WITH PROPOFOL: SHX5813

## 2015-09-29 SURGERY — ESOPHAGOGASTRODUODENOSCOPY (EGD) WITH PROPOFOL
Anesthesia: General

## 2015-09-29 MED ORDER — FENTANYL CITRATE (PF) 100 MCG/2ML IJ SOLN
INTRAMUSCULAR | Status: DC | PRN
  Administered 2015-09-29: 50 ug via INTRAVENOUS

## 2015-09-29 MED ORDER — GLYCOPYRROLATE 0.2 MG/ML IJ SOLN
INTRAMUSCULAR | Status: DC | PRN
  Administered 2015-09-29: 0.2 mg via INTRAVENOUS

## 2015-09-29 MED ORDER — LIDOCAINE HCL (CARDIAC) 20 MG/ML IV SOLN
INTRAVENOUS | Status: DC | PRN
  Administered 2015-09-29: 100 mg via INTRAVENOUS

## 2015-09-29 MED ORDER — MIDAZOLAM HCL 5 MG/5ML IJ SOLN
INTRAMUSCULAR | Status: DC | PRN
  Administered 2015-09-29: 1 mg via INTRAVENOUS

## 2015-09-29 MED ORDER — SODIUM CHLORIDE 0.9 % IV SOLN
INTRAVENOUS | Status: DC
  Administered 2015-09-29: 14:00:00 via INTRAVENOUS

## 2015-09-29 MED ORDER — PROPOFOL 10 MG/ML IV BOLUS
INTRAVENOUS | Status: DC | PRN
  Administered 2015-09-29: 90 mg via INTRAVENOUS

## 2015-09-29 NOTE — H&P (Signed)
  Date of Initial H&P: 09/01/2015  History reviewed, patient examined, no change in status, stable for surgery. 

## 2015-09-29 NOTE — Transfer of Care (Signed)
Immediate Anesthesia Transfer of Care Note  Patient: Bailey Graham  Procedure(s) Performed: Procedure(s): ESOPHAGOGASTRODUODENOSCOPY (EGD) WITH PROPOFOL (N/A)  Patient Location: PACU  Anesthesia Type:General  Level of Consciousness: awake, alert , oriented and patient cooperative  Airway & Oxygen Therapy: Patient Spontanous Breathing and Patient connected to nasal cannula oxygen  Post-op Assessment: Report given to RN and Post -op Vital signs reviewed and stable  Post vital signs: Reviewed and stable  Last Vitals:  Filed Vitals:   09/29/15 1345 09/29/15 1508  BP: 135/86 133/91  Pulse: 69 74  Temp: 36.8 C 36.7 C  Resp: 18 16    Complications: No apparent anesthesia complications

## 2015-09-29 NOTE — Op Note (Signed)
[facility] Patient Name: Bailey Graham Procedure Date: 09/29/2015 2:48 PM MRN: [id number] Account #: [id number] Date of Birth: [birth year] Admit Type: Outpatient Age: 55 Room: [facility] ENDO ROOM 4 Gender: Female Note Status: Finalized Procedure:         Upper GI endoscopy Indications:       Dyspepsia, Suspected esophageal reflux Providers:         Ezzard Standing. Bluford Kaufmann, MD Referring MD:      Teena Irani. Terance Hart, MD (Referring MD) Medicines:         Monitored Anesthesia Care Complications:     No immediate complications. Procedure:         Pre-Anesthesia Assessment:                    - Prior to the procedure, a History and Physical was                     performed, and patient medications, allergies and                     sensitivities were reviewed. The patient's tolerance of                     previous anesthesia was reviewed.                    - The risks and benefits of the procedure and the sedation                     options and risks were discussed with the patient. All                     questions were answered and informed consent was obtained.                    - After reviewing the risks and benefits, the patient was                     deemed in satisfactory condition to undergo the procedure.                    After obtaining informed consent, the endoscope was passed                     under direct vision. Throughout the procedure, the                     patient's blood pressure, pulse, and oxygen saturations                     were monitored continuously. The Olympus GIF-160 endoscope                     (S#. [id number]) was introduced through the mouth, and                     advanced to the second part of duodenum. The upper GI                     endoscopy was accomplished without difficulty. The patient                     tolerated the procedure well. Findings:      The examined esophagus was normal. Biopsies were taken with  a  cold       forceps for histology.      The entire examined stomach was normal.      The examined duodenum was normal. Impression:        - Normal esophagus. Biopsied.                    - Normal stomach.                    - Normal examined duodenum. Recommendation:    - Discharge patient to home.                    - Observe patient's clinical course.                    - The findings and recommendations were discussed with the                     patient.                    - Continue present medications. Procedure Code(s): --- Professional ---                    [phone], Esophagogastroduodenoscopy, flexible, transoral;                     with biopsy, single or multiple Diagnosis Code(s): --- Professional ---                    R10.13, Epigastric pain CPT copyright 2016 [employer]. All rights reserved. The codes documented in this report are preliminary and upon coder review may  be revised to meet current compliance requirements. Wallace Cullens, MD 09/29/2015 2:59:18 PM This report has been signed electronically. Number of Addenda: 0 Note Initiated On: 09/29/2015 2:48 PM      [facility]

## 2015-09-29 NOTE — Anesthesia Preprocedure Evaluation (Signed)
Anesthesia Evaluation  Patient identified by MRN, date of birth, ID band Patient awake    Reviewed: Allergy & Precautions, H&P , NPO status , Patient's Chart, lab work & pertinent test results, reviewed documented beta blocker date and time   History of Anesthesia Complications Negative for: history of anesthetic complications  Airway Mallampati: I  TM Distance: >3 FB Neck ROM: full    Dental no notable dental hx. (+) Caps, Teeth Intact   Pulmonary neg shortness of breath, asthma , neg sleep apnea, neg COPD, Recent URI  (nasal congestion, sinus infection, s/p z-pack),    Pulmonary exam normal breath sounds clear to auscultation       Cardiovascular Exercise Tolerance: Good hypertension, On Medications (-) angina(-) CAD, (-) Past MI, (-) Cardiac Stents and (-) CABG Normal cardiovascular exam(-) dysrhythmias (-) Valvular Problems/Murmurs Rhythm:regular Rate:Normal     Neuro/Psych PSYCHIATRIC DISORDERS (Depression and anxiety) negative neurological ROS     GI/Hepatic Neg liver ROS, GERD  ,  Endo/Other  negative endocrine ROS  Renal/GU negative Renal ROS  negative genitourinary   Musculoskeletal   Abdominal   Peds  Hematology negative hematology ROS (+)   Anesthesia Other Findings Past Medical History:   Anemia                                                       Asthma without status asthmaticus                            Breast lump                                                  Cervicalgia                                                  Depression                                                   Hypertension                                                 Fibrocystic breast disease                                   Genital herpes                                               Headache  Herpetic whitlow                                            Hyperlipidemia                                               Multiple sclerosis (HCC)                                     Phlebitis                                                    Pulmonary emboli (HCC)                                       Restless leg syndrome                                        Reproductive/Obstetrics negative OB ROS                             Anesthesia Physical Anesthesia Plan  ASA: II  Anesthesia Plan: General   Post-op Pain Management:    Induction:   Airway Management Planned:   Additional Equipment:   Intra-op Plan:   Post-operative Plan:   Informed Consent: I have reviewed the patients History and Physical, chart, labs and discussed the procedure including the risks, benefits and alternatives for the proposed anesthesia with the patient or authorized representative who has indicated his/her understanding and acceptance.   Dental Advisory Given  Plan Discussed with: Anesthesiologist, CRNA and Surgeon  Anesthesia Plan Comments:         Anesthesia Quick Evaluation

## 2015-10-01 ENCOUNTER — Encounter: Payer: Self-pay | Admitting: Gastroenterology

## 2015-10-01 LAB — SURGICAL PATHOLOGY

## 2015-10-02 NOTE — Anesthesia Postprocedure Evaluation (Signed)
Anesthesia Post Note  Patient: Ruhama Lehew Hagan  Procedure(s) Performed: Procedure(s) (LRB): ESOPHAGOGASTRODUODENOSCOPY (EGD) WITH PROPOFOL (N/A)  Patient location during evaluation: PACU Anesthesia Type: General Level of consciousness: awake and alert Pain management: pain level controlled Vital Signs Assessment: post-procedure vital signs reviewed and stable Respiratory status: spontaneous breathing, nonlabored ventilation, respiratory function stable and patient connected to nasal cannula oxygen Cardiovascular status: blood pressure returned to baseline and stable Postop Assessment: no signs of nausea or vomiting Anesthetic complications: no    Last Vitals:  Filed Vitals:   09/29/15 1527 09/29/15 1537  BP: 134/86 142/83  Pulse: 67 64  Temp:    Resp: 13 13    Last Pain: There were no vitals filed for this visit.               Yevette Edwards

## 2015-11-05 ENCOUNTER — Other Ambulatory Visit: Payer: Self-pay | Admitting: Obstetrics and Gynecology

## 2015-11-05 ENCOUNTER — Ambulatory Visit
Admission: RE | Admit: 2015-11-05 | Discharge: 2015-11-05 | Disposition: A | Discharge status: Home / Self Care | Payer: Medicare Other | Source: Ambulatory Visit | Attending: Obstetrics and Gynecology | Admitting: Obstetrics and Gynecology

## 2015-11-05 DIAGNOSIS — N6310 Unspecified lump in the right breast, unspecified quadrant: Secondary | ICD-10-CM

## 2015-11-05 DIAGNOSIS — N641 Fat necrosis of breast: Secondary | ICD-10-CM | POA: Diagnosis not present

## 2015-11-14 ENCOUNTER — Other Ambulatory Visit: Payer: Self-pay | Admitting: Obstetrics and Gynecology

## 2015-11-14 ENCOUNTER — Ambulatory Visit: Payer: Medicare Other

## 2015-11-14 DIAGNOSIS — N641 Fat necrosis of breast: Secondary | ICD-10-CM

## 2015-12-02 ENCOUNTER — Other Ambulatory Visit: Payer: Self-pay | Admitting: Otolaryngology

## 2015-12-02 DIAGNOSIS — E041 Nontoxic single thyroid nodule: Secondary | ICD-10-CM

## 2015-12-05 ENCOUNTER — Ambulatory Visit
Admission: RE | Admit: 2015-12-05 | Discharge: 2015-12-05 | Disposition: A | Discharge status: Home / Self Care | Payer: Medicare Other | Source: Ambulatory Visit | Attending: Otolaryngology | Admitting: Otolaryngology

## 2015-12-05 DIAGNOSIS — E041 Nontoxic single thyroid nodule: Secondary | ICD-10-CM | POA: Diagnosis present

## 2016-05-07 ENCOUNTER — Ambulatory Visit: Admission: RE | Admit: 2016-05-07 | Payer: Medicare Other | Source: Ambulatory Visit

## 2016-05-07 ENCOUNTER — Other Ambulatory Visit: Payer: Medicare Other

## 2016-07-26 ENCOUNTER — Ambulatory Visit
Admission: RE | Admit: 2016-07-26 | Discharge: 2016-07-26 | Disposition: A | Discharge status: Home / Self Care | Payer: Medicare Other | Source: Ambulatory Visit | Attending: Obstetrics and Gynecology | Admitting: Obstetrics and Gynecology

## 2016-07-26 DIAGNOSIS — N641 Fat necrosis of breast: Secondary | ICD-10-CM

## 2017-01-05 ENCOUNTER — Encounter (INDEPENDENT_AMBULATORY_CARE_PROVIDER_SITE_OTHER): Payer: Self-pay | Admitting: Vascular Surgery

## 2017-01-05 ENCOUNTER — Ambulatory Visit (INDEPENDENT_AMBULATORY_CARE_PROVIDER_SITE_OTHER): Payer: Medicare Other | Admitting: Vascular Surgery

## 2017-01-05 VITALS — BP 121/77 | HR 88 | Resp 17 | Wt 169.0 lb

## 2017-01-05 DIAGNOSIS — M79604 Pain in right leg: Secondary | ICD-10-CM

## 2017-01-05 DIAGNOSIS — M79605 Pain in left leg: Secondary | ICD-10-CM | POA: Diagnosis not present

## 2017-01-05 DIAGNOSIS — E785 Hyperlipidemia, unspecified: Secondary | ICD-10-CM | POA: Diagnosis not present

## 2017-01-05 DIAGNOSIS — I10 Essential (primary) hypertension: Secondary | ICD-10-CM | POA: Diagnosis not present

## 2017-01-05 NOTE — Progress Notes (Signed)
Subjective:    Patient ID: Bailey Graham, female    DOB: 02/20/1961, 56 y.o.   MRN: [id number] Chief Complaint  Patient presents with  . Leg Pain   Patient presents as a new patient referred by Dr. Sherryll Burger for "bilateral lower extremity pain". Patient endorses a week long history of worsening bilateral leg pain. Starts from her hips and radiations down her legs. Pain is equal on both extremities and constant. Patient does have a history of chronic pain requiring the use of narcotics. She has MS and deals with repeated episodes of shingles. She informs of a history of multiple DVT's and PE's - last one eight months ago. She denies any rest pain or ulceration to her lower extremity. No surgery or trauma to the lower extremities. Denies any SOB or chest pain. Denies any fever, nausea or vomiting.    Review of Systems  Constitutional: Negative.   HENT: Negative.   Eyes: Negative.   Respiratory: Negative.   Cardiovascular:       Bilateral Lower Extremity Pain  Gastrointestinal: Negative.   Endocrine: Negative.   Genitourinary: Negative.   Musculoskeletal: Negative.   Skin: Negative.   Allergic/Immunologic: Negative.   Neurological: Negative.   Hematological: Negative.   Psychiatric/Behavioral: Negative.       Objective:   Physical Exam  Constitutional: She is oriented to person, place, and time. She appears well-developed and well-nourished. No distress.  HENT:  Head: Normocephalic and atraumatic.  Eyes: Conjunctivae are normal. Pupils are equal, round, and reactive to light.  Neck: Normal range of motion.  Cardiovascular: Normal rate, regular rhythm, normal heart sounds and intact distal pulses.   Pulses:      Radial pulses are 2+ on the right side, and 2+ on the left side.       Dorsalis pedis pulses are 1+ on the right side, and 1+ on the left side.       Posterior tibial pulses are 1+ on the right side, and 1+ on the left side.  Bilateral calves are soft. No pain with  dorsiflexion bilaterally. No erythema.   Pulmonary/Chest: Effort normal.  Musculoskeletal: Normal range of motion. She exhibits no edema.  Neurological: She is alert and oriented to person, place, and time.  Skin: Skin is warm and dry. She is not diaphoretic.  Psychiatric: She has a normal mood and affect. Her behavior is normal. Judgment and thought content normal.  Vitals reviewed.  BP 121/77   Pulse 88   Resp 17   Wt 169 lb (76.7 kg)   BMI 29.94 kg/m   Past Medical History:  Diagnosis Date  . Anemia   . Asthma without status asthmaticus   . Breast lump   . Cervicalgia   . Depression   . Fibrocystic breast disease   . Genital herpes   . Headache   . Herpetic whitlow   . Hyperlipidemia   . Hypertension   . Multiple sclerosis (HCC)   . Phlebitis   . Pulmonary emboli (HCC)   . Restless leg syndrome    Social History   Social History  . Marital status: Single    Spouse name: N/A  . Number of children: N/A  . Years of education: N/A   Occupational History  . Not on file.   Social History Main Topics  . Smoking status: Never Smoker  . Smokeless tobacco: Never Used  . Alcohol use No  . Drug use: No  . Sexual activity: Not on file  Other Topics Concern  . Not on file   Social History Narrative  . No narrative on file   Past Surgical History:  Procedure Laterality Date  . ESOPHAGOGASTRODUODENOSCOPY (EGD) WITH PROPOFOL N/A 09/29/2015   Procedure: ESOPHAGOGASTRODUODENOSCOPY (EGD) WITH PROPOFOL;  Surgeon: Wallace Cullens, MD;  Location: [facility] ENDOSCOPY;  Service: Gastroenterology;  Laterality: N/A;  . REDUCTION MAMMAPLASTY Bilateral 2002 and 2013   Family History  Problem Relation Age of Onset  . Breast cancer Neg Hx    Allergies  Allergen Reactions  . Amoxicillin Hives  . Sulfa Antibiotics       Assessment & Plan:  Patient presents as a new patient referred by Dr. Sherryll Burger for "bilateral lower extremity pain". Patient endorses a week long history of worsening  bilateral leg pain. Starts from her hips and radiations down her legs. Pain is equal on both extremities and constant. Patient does have a history of chronic pain requiring the use of narcotics. She has MS and deals with repeated episodes of shingles. She informs of a history of multiple DVT's and PE's - last one eight months ago. She denies any rest pain or ulceration to her lower extremity. No surgery or trauma to the lower extremities. Denies any SOB or chest pain. Denies any fever, nausea or vomiting.   1. Pain in both lower extremities - New Patient with worsening bilateral pain. Low threshold for DVT however she does not want to go to ED to rule out. Even though I strongly recommended her to. She will return tomorrow for a bilateral DVT study.  Will order ABI to rule out PAD. Will order venous reflux to rule out reflux disease.  - VAS [place] ABI WITH/WO TBI; Future - VAS [place] LOWER EXTREMITY VENOUS (DVT); Future - VAS [place] LOWER EXTREMITY VENOUS REFLUX; Future  2. Hyperlipidemia, unspecified hyperlipidemia type - Stable Encouraged good control as its slows the progression of atherosclerotic disease  3. Essential hypertension - Stable Encouraged good control as its slows the progression of atherosclerotic disease  Current Outpatient Prescriptions on File Prior to Visit  Medication Sig Dispense Refill  . albuterol (PROVENTIL HFA;VENTOLIN HFA) 108 (90 Base) MCG/ACT inhaler Inhale 2 puffs into the lungs every 6 (six) hours as needed for wheezing or shortness of breath.    Marland Kitchen atorvastatin (LIPITOR) 40 MG tablet Take 40 mg by mouth daily.    . clonazePAM (KLONOPIN) 2 MG tablet Take 1-2 mg by mouth every 12 (twelve) hours as needed for anxiety.    . cyclobenzaprine (FLEXERIL) 10 MG tablet Take 10 mg by mouth every 8 (eight) hours as needed for muscle spasms.    . fluticasone (FLONASE) 50 MCG/ACT nasal spray Place 2 sprays into both nostrils daily.    Marland Kitchen gabapentin (NEURONTIN) 300 MG capsule Take 300 mg  by mouth daily.    Marland Kitchen ibuprofen (ADVIL,MOTRIN) 200 MG tablet Take 800 mg by mouth as needed for mild pain or moderate pain.    . metoprolol succinate (TOPROL-XL) 50 MG 24 hr tablet Take 50 mg by mouth daily. Take with or immediately following a meal.    . montelukast (SINGULAIR) 10 MG tablet Take 10 mg by mouth at bedtime.    Marland Kitchen oxyCODONE (OXY IR/ROXICODONE) 5 MG immediate release tablet Take 5-10 mg by mouth every 8 (eight) hours as needed for severe pain.    . pantoprazole (PROTONIX) 40 MG tablet Take 40 mg by mouth daily.    . rizatriptan (MAXALT-MLT) 10 MG disintegrating tablet Take 10 mg by  mouth as needed for migraine. May repeat in 2 hours if needed    . SUMAtriptan (IMITREX) 100 MG tablet Take 100 mg by mouth every 2 (two) hours as needed for migraine. May repeat in 2 hours if headache persists or recurs.    Marland Kitchen tiZANidine (ZANAFLEX) 4 MG tablet Take 4 mg by mouth every 6 (six) hours as needed for muscle spasms.    . valACYclovir (VALTREX) 500 MG tablet Take 500 mg by mouth 2 (two) times daily.    Marland Kitchen warfarin (COUMADIN) 5 MG tablet Take 5 mg by mouth daily.    . fentaNYL (DURAGESIC - DOSED MCG/HR) 75 MCG/HR Place 75 mcg onto the skin every 3 (three) days.    . Linaclotide (LINZESS) 290 MCG CAPS capsule Take 290 mcg by mouth daily.    Marland Kitchen lurasidone (LATUDA) 20 MG TABS tablet Take 20 mg by mouth at bedtime.    . traZODone (DESYREL) 150 MG tablet Take by mouth at bedtime.    Marland Kitchen zolpidem (AMBIEN) 10 MG tablet Take 10 mg by mouth at bedtime as needed for sleep.     No current facility-administered medications on file prior to visit.    There are no Patient Instructions on file for this visit. No Follow-up on file.  Teva Bronkema A Breeona Waid, PA-C

## 2017-01-06 ENCOUNTER — Ambulatory Visit (INDEPENDENT_AMBULATORY_CARE_PROVIDER_SITE_OTHER): Payer: Medicare Other

## 2017-01-06 DIAGNOSIS — M79604 Pain in right leg: Secondary | ICD-10-CM

## 2017-01-06 DIAGNOSIS — M79605 Pain in left leg: Secondary | ICD-10-CM | POA: Diagnosis not present

## 2017-01-11 ENCOUNTER — Encounter (INDEPENDENT_AMBULATORY_CARE_PROVIDER_SITE_OTHER): Payer: Self-pay | Admitting: Vascular Surgery

## 2017-01-11 ENCOUNTER — Ambulatory Visit (INDEPENDENT_AMBULATORY_CARE_PROVIDER_SITE_OTHER): Payer: Medicare Other | Admitting: Vascular Surgery

## 2017-01-11 VITALS — BP 128/78 | HR 81 | Resp 17

## 2017-01-11 DIAGNOSIS — M79605 Pain in left leg: Secondary | ICD-10-CM

## 2017-01-11 DIAGNOSIS — I872 Venous insufficiency (chronic) (peripheral): Secondary | ICD-10-CM

## 2017-01-11 DIAGNOSIS — M79604 Pain in right leg: Secondary | ICD-10-CM

## 2017-01-11 NOTE — Progress Notes (Signed)
Subjective:    Patient ID: Bailey Graham, female    DOB: 1960/09/28, 56 y.o.   MRN: [id number] Chief Complaint  Patient presents with  . Follow-up   Patient presents to review vascular studies. She was last seen on 01/05/17 for evaluation of lower extremity pain. Her symptoms remain stable. She has started to wear medical grade one compression and engaging in daily elevation. 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    Review of Systems  Constitutional: Negative.   HENT: Negative.   Eyes: Negative.   Respiratory: Negative.   Cardiovascular: Negative.   Gastrointestinal: Negative.   Endocrine: Negative.   Genitourinary: Negative.   Musculoskeletal: Negative.   Skin: Negative.   Allergic/Immunologic: Negative.   Neurological: Negative.   Hematological: Negative.   Psychiatric/Behavioral: Negative.       Objective:   Physical Exam  Physical exam has not changed since last visit.  Constitutional: She is oriented to person, place, and time. She appears well-developed and well-nourished. No distress.  HENT:  Head: Normocephalic and atraumatic.  Eyes: Conjunctivae are normal. Pupils are equal, round, and reactive to light.  Neck: Normal range of motion.  Cardiovascular: Normal rate, regular rhythm, normal heart sounds and intact distal pulses.   Pulses:      Radial pulses are 2+ on the right side, and 2+ on the left side.       Dorsalis pedis pulses are 1+ on the right side, and 1+ on the left side.       Posterior tibial pulses are 1+ on the right side, and 1+ on the left side.  Bilateral calves are soft. No pain with dorsiflexion bilaterally. No erythema.   Pulmonary/Chest: Effort normal.  Musculoskeletal: Normal range of motion. She exhibits no edema.  Neurological: She is alert and oriented to person, place, and time.  Skin: Skin is warm and dry. She  is not diaphoretic.  Psychiatric: She has a normal mood and affect. Her behavior is normal. Judgment and thought content normal.   BP 128/78   Pulse 81   Resp 17   Past Medical History:  Diagnosis Date  . Anemia   . Asthma without status asthmaticus   . Breast lump   . Cervicalgia   . Depression   . Fibrocystic breast disease   . Genital herpes   . Headache   . Herpetic whitlow   . Hyperlipidemia   . Hypertension   . Multiple sclerosis (HCC)   . Phlebitis   . Pulmonary emboli (HCC)   . Restless leg syndrome    Social History   Social History  . Marital status: Single    Spouse name: N/A  . Number of children: N/A  . Years of education: N/A   Occupational History  . Not on file.   Social History Main Topics  . Smoking status: Never Smoker  . Smokeless tobacco: Never Used  . Alcohol use No  . Drug use: No  . Sexual activity: Not on file   Other Topics Concern  . Not on file   Social History Narrative  . No narrative on file    Past Surgical History:  Procedure Laterality Date  . ESOPHAGOGASTRODUODENOSCOPY (EGD) WITH PROPOFOL N/A 09/29/2015   Procedure: ESOPHAGOGASTRODUODENOSCOPY (EGD) WITH PROPOFOL;  Surgeon: Wallace Cullens, MD;  Location: [facility] ENDOSCOPY;  Service: Gastroenterology;  Laterality: N/A;  . REDUCTION  MAMMAPLASTY Bilateral 2002 and 2013   Family History  Problem Relation Age of Onset  . Breast cancer Neg Hx    Allergies  Allergen Reactions  . Amoxicillin Hives  . Sulfa Antibiotics       Assessment & Plan:  Patient presents to review vascular studies. She was last seen on 01/05/17 for evaluation of lower extremity pain. Her symptoms remain stable. She has started to wear medical grade one compression and engaging in daily elevation. 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   1. Venous insufficiency of both lower extremities - New No  DVT. Reflux on duplex. Continue medical grade one compression, elevation and remaining active. Patient to follow up in three months to discuss conservative therapy.   2. Pain in both lower extremities - Stable As Above  Current Outpatient Prescriptions on File Prior to Visit  Medication Sig Dispense Refill  . albuterol (PROVENTIL HFA;VENTOLIN HFA) 108 (90 Base) MCG/ACT inhaler Inhale 2 puffs into the lungs every 6 (six) hours as needed for wheezing or shortness of breath.    Marland Kitchen atorvastatin (LIPITOR) 40 MG tablet Take 40 mg by mouth daily.    . clonazePAM (KLONOPIN) 2 MG tablet Take 1-2 mg by mouth every 12 (twelve) hours as needed for anxiety.    . cyclobenzaprine (FLEXERIL) 10 MG tablet Take 10 mg by mouth every 8 (eight) hours as needed for muscle spasms.    . fentaNYL (DURAGESIC - DOSED MCG/HR) 75 MCG/HR Place 75 mcg onto the skin every 3 (three) days.    . fluticasone (FLONASE) 50 MCG/ACT nasal spray Place 2 sprays into both nostrils daily.    Marland Kitchen gabapentin (NEURONTIN) 300 MG capsule Take 300 mg by mouth daily.    Marland Kitchen ibuprofen (ADVIL,MOTRIN) 200 MG tablet Take 800 mg by mouth as needed for mild pain or moderate pain.    . Linaclotide (LINZESS) 290 MCG CAPS capsule Take 290 mcg by mouth daily.    Marland Kitchen lurasidone (LATUDA) 20 MG TABS tablet Take 20 mg by mouth at bedtime.    . metoprolol succinate (TOPROL-XL) 50 MG 24 hr tablet Take 50 mg by mouth daily. Take with or immediately following a meal.    . montelukast (SINGULAIR) 10 MG tablet Take 10 mg by mouth at bedtime.    . naloxegol oxalate (MOVANTIK) 25 MG TABS tablet Take by mouth.    . oxyCODONE (OXY IR/ROXICODONE) 5 MG immediate release tablet Take 5-10 mg by mouth every 8 (eight) hours as needed for severe pain.    . pantoprazole (PROTONIX) 40 MG tablet Take 40 mg by mouth daily.    . QUEtiapine (SEROQUEL) 100 MG tablet Take 2 to 3 tablets PO QHS    . rizatriptan (MAXALT-MLT) 10 MG disintegrating tablet Take 10 mg by mouth as needed for  migraine. May repeat in 2 hours if needed    . SUMAtriptan (IMITREX) 100 MG tablet Take 100 mg by mouth every 2 (two) hours as needed for migraine. May repeat in 2 hours if headache persists or recurs.    Marland Kitchen tiZANidine (ZANAFLEX) 4 MG tablet Take 4 mg by mouth every 6 (six) hours as needed for muscle spasms.    . traZODone (DESYREL) 150 MG tablet Take by mouth at bedtime.    . valACYclovir (VALTREX) 500 MG tablet Take 500 mg by mouth 2 (two) times daily.    Marland Kitchen warfarin (  COUMADIN) 5 MG tablet Take 5 mg by mouth daily.    Marland Kitchen zolpidem (AMBIEN) 10 MG tablet Take 10 mg by mouth at bedtime as needed for sleep.     No current facility-administered medications on file prior to visit.    There are no Patient Instructions on file for this visit. No Follow-up on file.  Landynn Dupler A Jennifer Payes, PA-C

## 2017-03-07 ENCOUNTER — Ambulatory Visit: Admit: 2017-03-07 | Payer: Medicare Other | Admitting: Gastroenterology

## 2017-03-07 SURGERY — COLONOSCOPY WITH PROPOFOL
Anesthesia: General

## 2017-04-21 ENCOUNTER — Encounter (INDEPENDENT_AMBULATORY_CARE_PROVIDER_SITE_OTHER): Payer: Self-pay | Admitting: Vascular Surgery

## 2017-04-21 ENCOUNTER — Encounter (INDEPENDENT_AMBULATORY_CARE_PROVIDER_SITE_OTHER): Payer: Medicare Other

## 2017-04-21 ENCOUNTER — Ambulatory Visit (INDEPENDENT_AMBULATORY_CARE_PROVIDER_SITE_OTHER): Payer: Medicare Other | Admitting: Vascular Surgery

## 2017-04-21 VITALS — BP 125/82 | HR 79 | Resp 16 | Wt 170.0 lb

## 2017-04-21 DIAGNOSIS — G35 Multiple sclerosis: Secondary | ICD-10-CM | POA: Diagnosis not present

## 2017-04-21 DIAGNOSIS — E785 Hyperlipidemia, unspecified: Secondary | ICD-10-CM

## 2017-04-21 DIAGNOSIS — I10 Essential (primary) hypertension: Secondary | ICD-10-CM | POA: Diagnosis not present

## 2017-04-21 DIAGNOSIS — I872 Venous insufficiency (chronic) (peripheral): Secondary | ICD-10-CM | POA: Diagnosis not present

## 2017-04-21 DIAGNOSIS — M79605 Pain in left leg: Secondary | ICD-10-CM

## 2017-04-21 DIAGNOSIS — M79604 Pain in right leg: Secondary | ICD-10-CM

## 2017-04-24 DIAGNOSIS — G35 Multiple sclerosis: Secondary | ICD-10-CM | POA: Insufficient documentation

## 2017-04-24 NOTE — Progress Notes (Signed)
MRN : [id number]  Bailey Graham is a 56 y.o. ([birth year]) female who presents with chief complaint of  Chief Complaint  Patient presents with  . Varicose Veins    49month follow up  .  History of Present Illness: The patient is seen for evaluation of painful lower extremities. Patient notes the pain is variable and not always associated with activity.  The pain is somewhat consistent day to day occurring on most days. The patient notes the pain also occurs with standing and routinely seems worse as the day wears on. The pain has been progressive over the past several years. The patient states these symptoms are causing  a profound negative impact on quality of life and daily activities.  She describes band like pains down her legs laterally left greater than right.  The patient denies rest pain or dangling of an extremity off the side of the bed during the night for relief. No open wounds or sores at this time. No history of DVT or phlebitis. No prior interventions or surgeries.  There is a  history of back problems and DJD of the lumbar and sacral spine.    Current Meds  Medication Sig  . albuterol (PROVENTIL HFA;VENTOLIN HFA) 108 (90 Base) MCG/ACT inhaler Inhale 2 puffs into the lungs every 6 (six) hours as needed for wheezing or shortness of breath.  Marland Kitchen atorvastatin (LIPITOR) 40 MG tablet Take 40 mg by mouth daily.  . clonazePAM (KLONOPIN) 2 MG tablet Take 1-2 mg by mouth every 12 (twelve) hours as needed for anxiety.  . cyclobenzaprine (FLEXERIL) 10 MG tablet Take 10 mg by mouth every 8 (eight) hours as needed for muscle spasms.  . fentaNYL (DURAGESIC - DOSED MCG/HR) 75 MCG/HR Place 75 mcg onto the skin every 3 (three) days.  . fluticasone (FLONASE) 50 MCG/ACT nasal spray Place 2 sprays into both nostrils daily.  Marland Kitchen gabapentin (NEURONTIN) 300 MG capsule Take 300 mg by mouth daily.  Marland Kitchen ibuprofen (ADVIL,MOTRIN) 200 MG tablet Take 800 mg by mouth as needed for mild pain or  moderate pain.  . Linaclotide (LINZESS) 290 MCG CAPS capsule Take 290 mcg by mouth daily.  Marland Kitchen lurasidone (LATUDA) 20 MG TABS tablet Take 20 mg by mouth at bedtime.  . metoprolol succinate (TOPROL-XL) 50 MG 24 hr tablet Take 50 mg by mouth daily. Take with or immediately following a meal.  . montelukast (SINGULAIR) 10 MG tablet Take 10 mg by mouth at bedtime.  . naloxegol oxalate (MOVANTIK) 25 MG TABS tablet Take by mouth.  . oxyCODONE (OXY IR/ROXICODONE) 5 MG immediate release tablet Take 5-10 mg by mouth every 8 (eight) hours as needed for severe pain.  . pantoprazole (PROTONIX) 40 MG tablet Take 40 mg by mouth daily.  . QUEtiapine (SEROQUEL) 100 MG tablet Take 2 to 3 tablets PO QHS  . rizatriptan (MAXALT-MLT) 10 MG disintegrating tablet Take 10 mg by mouth as needed for migraine. May repeat in 2 hours if needed  . SUMAtriptan (IMITREX) 100 MG tablet Take 100 mg by mouth every 2 (two) hours as needed for migraine. May repeat in 2 hours if headache persists or recurs.  Marland Kitchen tiZANidine (ZANAFLEX) 4 MG tablet Take 4 mg by mouth every 6 (six) hours as needed for muscle spasms.  . traZODone (DESYREL) 150 MG tablet Take by mouth at bedtime.  . valACYclovir (VALTREX) 500 MG tablet Take 500 mg by mouth 2 (two) times daily.  Marland Kitchen warfarin (COUMADIN) 5 MG tablet Take 5 mg  by mouth daily.  Marland Kitchen zolpidem (AMBIEN) 10 MG tablet Take 10 mg by mouth at bedtime as needed for sleep.    Past Medical History:  Diagnosis Date  . Anemia   . Asthma without status asthmaticus   . Breast lump   . Cervicalgia   . Depression   . Fibrocystic breast disease   . Genital herpes   . Headache   . Herpetic whitlow   . Hyperlipidemia   . Hypertension   . Multiple sclerosis (HCC)   . Phlebitis   . Pulmonary emboli (HCC)   . Restless leg syndrome     Past Surgical History:  Procedure Laterality Date  . ESOPHAGOGASTRODUODENOSCOPY (EGD) WITH PROPOFOL N/A 09/29/2015   Procedure: ESOPHAGOGASTRODUODENOSCOPY (EGD) WITH PROPOFOL;   Surgeon: Wallace Cullens, MD;  Location: [facility] ENDOSCOPY;  Service: Gastroenterology;  Laterality: N/A;  . REDUCTION MAMMAPLASTY Bilateral 2002 and 2013    Social History Social History  Substance Use Topics  . Smoking status: Never Smoker  . Smokeless tobacco: Never Used  . Alcohol use No    Family History Family History  Problem Relation Age of Onset  . Breast cancer Neg Hx     Allergies  Allergen Reactions  . Amoxicillin Hives  . Sulfa Antibiotics      REVIEW OF SYSTEMS (Negative unless checked)  Constitutional: Weight loss  Fever  Chills Cardiac: Chest pain   Chest pressure   Palpitations   Shortness of breath when laying flat   Shortness of breath with exertion. Vascular:  Pain in legs with walking   Pain in legs at rest  History of DVT   Phlebitis   Swelling in legs   Varicose veins   Non-healing ulcers Pulmonary:   Uses home oxygen   Productive cough   Hemoptysis   Wheeze  COPD   Asthma Neurologic:  Dizziness   Seizures   History of stroke   History of TIA  Aphasia   Vissual changes   Weakness or numbness in arm   Weakness or numbness in leg Musculoskeletal:   Joint swelling   Joint pain   Low back pain Hematologic:  Easy bruising  Easy bleeding   Hypercoagulable state   Anemic Gastrointestinal:  Diarrhea   Vomiting  Gastroesophageal reflux/heartburn   Difficulty swallowing. Genitourinary:  Chronic kidney disease   Difficult urination  Frequent urination   Blood in urine Skin:  Rashes   Ulcers  Psychological:  History of anxiety    History of major depression.  Physical Examination  Vitals:   04/21/17 1540  BP: 125/82  Pulse: 79  Resp: 16  Weight: 77.1 kg (170 lb)   Body mass index is 30.11 kg/m. Gen: WD/WN, NAD Head: [place]/AT, No temporalis wasting.  Ear/Nose/Throat: Hearing grossly intact, nares w/o erythema or drainage Eyes: PER, EOMI, sclera nonicteric.  Neck:  Supple, no large masses.   Pulmonary:  Good air movement, no audible wheezing bilaterally, no use of accessory muscles.  Cardiac: RRR, no JVD Vascular:   Vessel Right Left  Radial Palpable Palpable  Ulnar Palpable Palpable  Brachial Palpable Palpable  Carotid Palpable Palpable  Femoral Palpable Palpable  Popliteal Palpable Palpable  PT Palpable Palpable  DP Palpable Palpable  Gastrointestinal: Non-distended. No guarding/no peritoneal signs.  Musculoskeletal: M/S 5/5 throughout.  No deformity or atrophy.  Neurologic: CN 2-12 intact. Symmetrical.  Speech is fluent. Motor exam as listed above. Psychiatric: Judgment intact, Mood & affect appropriate for pt's clinical situation. Dermatologic: No rashes or ulcers noted.  No changes consistent with cellulitis. Lymph : No lichenification or skin changes of chronic lymphedema.  CBC Lab Results  Component Value Date   WBC 8.8 06/29/2013   HGB 12.8 06/29/2013   HCT 37.8 06/29/2013   MCV 95 06/29/2013   PLT 259 06/29/2013    BMET    Component Value Date/Time   NA 139 06/29/2013 1654   K 4.1 06/29/2013 1654   CL 106 06/29/2013 1654   CO2 30 06/29/2013 1654   GLUCOSE 83 06/29/2013 1654   BUN 9 06/29/2013 1654   CREATININE 0.91 06/29/2013 1654   CALCIUM 9.0 06/29/2013 1654   GFRNONAA >60 06/29/2013 1654   GFRAA >60 06/29/2013 1654   CrCl cannot be calculated (Patient's most recent lab result is older than the maximum 21 days allowed.).  COAG Lab Results  Component Value Date   INR 1.6 06/29/2013   INR 2.9 01/10/2013   INR 1.3 04/07/2012    Radiology No results found.  Assessment/Plan 1. Multiple sclerosis (HCC) I believe that the majority of her leg pain is secondary to her MS  Her venous disease seems unlikely to cause her leg pain.  I have discussed this with her and have made it quite clear that laser ablation is likely to help her leg pain minimally.  I will discuss this with Dr Sherryll Burger  2. Venous insufficiency of  both lower extremities No surgery or intervention at this point in time.    I have had a long discussion with the patient regarding venous insufficiency and why it  causes symptoms. I have discussed with the patient the chronic skin changes that accompany venous insufficiency and the long term sequela such as infection and ulceration.  Patient will begin wearing graduated compression stockings class 1 (20-30 mmHg) or compression wraps on a daily basis a prescription was given. The patient will put the stockings on first thing in the morning and removing them in the evening. The patient is instructed specifically not to sleep in the stockings.    In addition, behavioral modification including several periods of elevation of the lower extremities during the day will be continued. I have demonstrated that proper elevation is a position with the ankles at heart level.  The patient is instructed to begin routine exercise, especially walking on a daily basis  Following the review of the ultrasound the patient will follow up in 2-3 months to reassess the degree of swelling and the control that graduated compression stockings or compression wraps  is offering.   The patient can be assessed for a Lymph Pump at that time  3. Essential hypertension Continue antihypertensive medications as already ordered, these medications have been reviewed and there are no changes at this time.   4. Hyperlipidemia, unspecified hyperlipidemia type Continue statin as ordered and reviewed, no changes at this time   5. Pain in both lower extremities Likely secondary to MS    Levora Dredge, MD  04/24/2017 3:33 PM

## 2017-06-09 ENCOUNTER — Ambulatory Visit (INDEPENDENT_AMBULATORY_CARE_PROVIDER_SITE_OTHER): Payer: Medicare Other | Admitting: Vascular Surgery

## 2017-06-09 ENCOUNTER — Encounter (INDEPENDENT_AMBULATORY_CARE_PROVIDER_SITE_OTHER): Payer: Self-pay | Admitting: Vascular Surgery

## 2017-06-09 VITALS — BP 141/89 | HR 77 | Resp 16 | Ht 63.0 in | Wt 168.0 lb

## 2017-06-09 DIAGNOSIS — I10 Essential (primary) hypertension: Secondary | ICD-10-CM

## 2017-06-09 DIAGNOSIS — E785 Hyperlipidemia, unspecified: Secondary | ICD-10-CM | POA: Diagnosis not present

## 2017-06-09 DIAGNOSIS — I872 Venous insufficiency (chronic) (peripheral): Secondary | ICD-10-CM

## 2017-06-09 DIAGNOSIS — M79604 Pain in right leg: Secondary | ICD-10-CM

## 2017-06-09 DIAGNOSIS — M79605 Pain in left leg: Secondary | ICD-10-CM

## 2017-06-09 NOTE — Progress Notes (Signed)
MRN : [id number]  Bailey Graham is a 56 y.o. (Jan 14, 1961) female who presents with chief complaint of  Chief Complaint  Patient presents with  . Follow-up    PT. request to discuss next steps, laser?  .  History of Present Illness: The patient returns for followup evaluation 3 months after the initial visit. The patient continues to have pain in the lower extremities with dependency. The pain is lessened with elevation. Graduated compression stockings, Class I (20-30 mmHg), have been worn but the stockings do not eliminate the leg pain. Over-the-counter analgesics do not improve the symptoms. The degree of discomfort continues to interfere with daily activities. The patient notes the pain in the legs is causing problems with daily exercise, at the workplace and even with household activities and maintenance such as standing in the kitchen preparing meals and doing dishes.   Venous ultrasound shows moderate to severe reflux in the deep venous system bilaterally, no evidence of acute DVT.  Superficial reflux is present in the right GSV and SSV and the left SSV  Current Meds  Medication Sig  . albuterol (PROVENTIL HFA;VENTOLIN HFA) 108 (90 Base) MCG/ACT inhaler Inhale 2 puffs into the lungs every 6 (six) hours as needed for wheezing or shortness of breath.  Marland Kitchen atorvastatin (LIPITOR) 40 MG tablet Take 40 mg by mouth daily.  . clonazePAM (KLONOPIN) 2 MG tablet Take 1-2 mg by mouth every 12 (twelve) hours as needed for anxiety.  . cyclobenzaprine (FLEXERIL) 10 MG tablet Take 10 mg by mouth every 8 (eight) hours as needed for muscle spasms.  . fluticasone (FLONASE) 50 MCG/ACT nasal spray Place 2 sprays into both nostrils daily.  Marland Kitchen gabapentin (NEURONTIN) 300 MG capsule Take 300 mg by mouth daily.  Marland Kitchen ibuprofen (ADVIL,MOTRIN) 200 MG tablet Take 800 mg by mouth as needed for mild pain or moderate pain.  . metoprolol succinate (TOPROL-XL) 50 MG 24 hr tablet Take 50 mg by mouth daily. Take  with or immediately following a meal.  . montelukast (SINGULAIR) 10 MG tablet Take 10 mg by mouth at bedtime.  . naloxegol oxalate (MOVANTIK) 25 MG TABS tablet Take by mouth.  . oxyCODONE (OXY IR/ROXICODONE) 5 MG immediate release tablet Take 5-10 mg by mouth every 8 (eight) hours as needed for severe pain.  . pantoprazole (PROTONIX) 40 MG tablet Take 40 mg by mouth daily.  . QUEtiapine (SEROQUEL) 100 MG tablet Take 2 to 3 tablets PO QHS  . rizatriptan (MAXALT-MLT) 10 MG disintegrating tablet Take 10 mg by mouth as needed for migraine. May repeat in 2 hours if needed  . SUMAtriptan (IMITREX) 100 MG tablet Take 100 mg by mouth every 2 (two) hours as needed for migraine. May repeat in 2 hours if headache persists or recurs.  Marland Kitchen tiZANidine (ZANAFLEX) 4 MG tablet Take 4 mg by mouth every 6 (six) hours as needed for muscle spasms.  . valACYclovir (VALTREX) 500 MG tablet Take 500 mg by mouth 2 (two) times daily.  Marland Kitchen warfarin (COUMADIN) 5 MG tablet Take 5 mg by mouth daily.    Past Medical History:  Diagnosis Date  . Anemia   . Asthma without status asthmaticus   . Breast lump   . Cervicalgia   . Depression   . Fibrocystic breast disease   . Genital herpes   . Headache   . Herpetic whitlow   . Hyperlipidemia   . Hypertension   . Multiple sclerosis (HCC)   . Phlebitis   . Pulmonary  emboli (HCC)   . Restless leg syndrome     Past Surgical History:  Procedure Laterality Date  . ESOPHAGOGASTRODUODENOSCOPY (EGD) WITH PROPOFOL N/A 09/29/2015   Procedure: ESOPHAGOGASTRODUODENOSCOPY (EGD) WITH PROPOFOL;  Surgeon: Wallace Cullens, MD;  Location: [facility] ENDOSCOPY;  Service: Gastroenterology;  Laterality: N/A;  . REDUCTION MAMMAPLASTY Bilateral 2002 and 2013    Social History Social History  Substance Use Topics  . Smoking status: Never Smoker  . Smokeless tobacco: Never Used  . Alcohol use No    Family History Family History  Problem Relation Age of Onset  . Breast cancer Neg Hx      Allergies  Allergen Reactions  . Amoxicillin Hives  . Sulfa Antibiotics      REVIEW OF SYSTEMS (Negative unless checked)  Constitutional: [] Weight loss  [] Fever  [] Chills Cardiac: [] Chest pain   [] Chest pressure   [] Palpitations   [] Shortness of breath when laying flat   [] Shortness of breath with exertion. Vascular:  [] Pain in legs with walking   [x] Pain in legs at rest  [] History of DVT   [] Phlebitis   [x] Swelling in legs   [x] Varicose veins   [] Non-healing ulcers Pulmonary:   [] Uses home oxygen   [] Productive cough   [] Hemoptysis   [] Wheeze  [] COPD   [] Asthma Neurologic:  [] Dizziness   [] Seizures   [] History of stroke   [] History of TIA  [] Aphasia   [] Vissual changes   [] Weakness or numbness in arm   [] Weakness or numbness in leg Musculoskeletal:   [] Joint swelling   [] Joint pain   [] Low back pain Hematologic:  [] Easy bruising  [] Easy bleeding   [] Hypercoagulable state   [] Anemic Gastrointestinal:  [] Diarrhea   [] Vomiting  [] Gastroesophageal reflux/heartburn   [] Difficulty swallowing. Genitourinary:  [] Chronic kidney disease   [] Difficult urination  [] Frequent urination   [] Blood in urine Skin:  [] Rashes   [] Ulcers  Psychological:  [] History of anxiety   []  History of major depression.  Physical Examination  Vitals:   06/09/17 0841  BP: (!) 141/89  Pulse: 77  Resp: 16  Weight: 168 lb (76.2 kg)  Height: 5\' 3"  (1.6 m)   Body mass index is 29.76 kg/m. Gen: WD/WN, NAD Head: [place]/AT, No temporalis wasting.  Ear/Nose/Throat: Hearing grossly intact, nares w/o erythema or drainage Eyes: PER, EOMI, sclera nonicteric.  Neck: Supple, no large masses.   Pulmonary:  Good air movement, no audible wheezing bilaterally, no use of accessory muscles.  Cardiac: RRR, no JVD Vascular: diffuse varicosities present bilaterally.  Mild venous stasis changes to the legs bilaterally.  2-3+ soft pitting edema bilaterally Vessel Right Left  Radial Palpable Palpable  PT Palpable Palpable  DP  Palpable Palpable  Gastrointestinal: Non-distended. No guarding/no peritoneal signs.  Musculoskeletal: M/S 5/5 throughout.  No deformity or atrophy.  Neurologic: CN 2-12 intact. Symmetrical.  Speech is fluent. Motor exam as listed above. Psychiatric: Judgment intact, Mood & affect appropriate for pt's clinical situation. Dermatologic: mild venous rashes no ulcers noted.  No changes consistent with cellulitis. Lymph : No lichenification or skin changes of chronic lymphedema.  CBC Lab Results  Component Value Date   WBC 8.8 06/29/2013   HGB 12.8 06/29/2013   HCT 37.8 06/29/2013   MCV 95 06/29/2013   PLT 259 06/29/2013    BMET    Component Value Date/Time   NA 139 06/29/2013 1654   K 4.1 06/29/2013 1654   CL 106 06/29/2013 1654   CO2 30 06/29/2013 1654   GLUCOSE 83 06/29/2013 1654  BUN 9 06/29/2013 1654   CREATININE 0.91 06/29/2013 1654   CALCIUM 9.0 06/29/2013 1654   GFRNONAA >60 06/29/2013 1654   GFRAA >60 06/29/2013 1654   CrCl cannot be calculated (Patient's most recent lab result is older than the maximum 21 days allowed.).  COAG Lab Results  Component Value Date   INR 1.6 06/29/2013   INR 2.9 01/10/2013   INR 1.3 04/07/2012    Radiology No results found.   Assessment/Plan 1. Pain in both lower extremities Recommend  I have reviewed my previous  discussion with the patient regarding  varicose veins and why they cause symptoms. Patient will continue  wearing graduated compression stockings class 1 on a daily basis, beginning first thing in the morning and removing them in the evening.    In addition, behavioral modification including elevation during the day was again discussed and this will continue.  The patient has utilized over the counter pain medications and has been exercising.  However, at this time conservative therapy has not alleviated the patient's symptoms of leg pain and swelling  Recommend: laser ablation of the right and  left great  saphenous veins to eliminate the symptoms of pain and swelling of the lower extremities caused by the severe superficial venous reflux disease.   I have also discussed that with her deep venous insufficiency that the ablation of the superficial saphenous veins will help but is not likely to eliminate her pain and swelling.  Also she asked about numbness fo the feet which I discussed with her that this is more likely a symptom of DJD of the back or her MS.  She verbally acknowledged this.   A total of 30 minutes was spent with this patient and greater than 50% was spent in counseling and coordination of care with the patient.  Discussion included the treatment options for vascular disease including indications for surgery and intervention.  Also discussed is the appropriate timing of treatment.  In addition medical therapy was discussed.  2. Venous insufficiency of both lower extremities See #1  3. Hyperlipidemia, unspecified hyperlipidemia type Continue statin as ordered and reviewed, no changes at this time   4. Essential hypertension Continue antihypertensive medications as already ordered, these medications have been reviewed and there are no changes at this time.     Levora DredgeGregory Katalyna Socarras, MD  06/09/2017 7:13 PM

## 2017-06-20 ENCOUNTER — Other Ambulatory Visit: Payer: Self-pay | Admitting: Nurse Practitioner

## 2017-06-20 DIAGNOSIS — R29898 Other symptoms and signs involving the musculoskeletal system: Secondary | ICD-10-CM

## 2017-06-23 ENCOUNTER — Other Ambulatory Visit: Payer: Self-pay | Admitting: Family Medicine

## 2017-06-23 DIAGNOSIS — Z1231 Encounter for screening mammogram for malignant neoplasm of breast: Secondary | ICD-10-CM

## 2017-06-25 ENCOUNTER — Ambulatory Visit
Admission: RE | Admit: 2017-06-25 | Discharge: 2017-06-25 | Disposition: A | Discharge status: Home / Self Care | Payer: Medicare Other | Source: Ambulatory Visit | Attending: Nurse Practitioner | Admitting: Nurse Practitioner

## 2017-06-25 DIAGNOSIS — M5126 Other intervertebral disc displacement, lumbar region: Secondary | ICD-10-CM | POA: Insufficient documentation

## 2017-06-25 DIAGNOSIS — M8938 Hypertrophy of bone, other site: Secondary | ICD-10-CM | POA: Insufficient documentation

## 2017-06-25 DIAGNOSIS — R29898 Other symptoms and signs involving the musculoskeletal system: Secondary | ICD-10-CM | POA: Insufficient documentation

## 2017-06-25 DIAGNOSIS — M48061 Spinal stenosis, lumbar region without neurogenic claudication: Secondary | ICD-10-CM | POA: Insufficient documentation

## 2017-07-01 ENCOUNTER — Emergency Department
Admission: EM | Admit: 2017-07-01 | Discharge: 2017-07-01 | Disposition: A | Discharge status: Home / Self Care | Payer: Medicare Other | Attending: Emergency Medicine | Admitting: Emergency Medicine

## 2017-07-01 ENCOUNTER — Other Ambulatory Visit: Payer: Self-pay

## 2017-07-01 ENCOUNTER — Encounter: Payer: Self-pay | Admitting: Emergency Medicine

## 2017-07-01 DIAGNOSIS — M62830 Muscle spasm of back: Secondary | ICD-10-CM | POA: Insufficient documentation

## 2017-07-01 DIAGNOSIS — Z7901 Long term (current) use of anticoagulants: Secondary | ICD-10-CM | POA: Insufficient documentation

## 2017-07-01 DIAGNOSIS — J45909 Unspecified asthma, uncomplicated: Secondary | ICD-10-CM | POA: Diagnosis not present

## 2017-07-01 DIAGNOSIS — M5442 Lumbago with sciatica, left side: Secondary | ICD-10-CM | POA: Diagnosis not present

## 2017-07-01 DIAGNOSIS — I10 Essential (primary) hypertension: Secondary | ICD-10-CM | POA: Insufficient documentation

## 2017-07-01 DIAGNOSIS — Z79899 Other long term (current) drug therapy: Secondary | ICD-10-CM | POA: Diagnosis not present

## 2017-07-01 DIAGNOSIS — M545 Low back pain: Secondary | ICD-10-CM | POA: Diagnosis present

## 2017-07-01 MED ORDER — ORPHENADRINE CITRATE ER 100 MG PO TB12: 100.0000 mg | ORAL_TABLET | Freq: Two times a day (BID) | ORAL | 0 refills | Status: DC

## 2017-07-01 MED ORDER — PREDNISONE 10 MG (21) PO TBPK: ORAL_TABLET | ORAL | 0 refills | Status: DC

## 2017-07-01 MED ORDER — LIDOCAINE HCL (PF) 1 % IJ SOLN
5.0000 mL | Freq: Once | INTRAMUSCULAR | Status: AC
  Administered 2017-07-01: 5 mL
  Filled 2017-07-01: qty 5

## 2017-07-01 MED ORDER — TRIAMCINOLONE ACETONIDE 40 MG/ML IJ SUSP
40.0000 mg | Freq: Once | INTRAMUSCULAR | Status: AC
  Administered 2017-07-01: 40 mg via INTRAMUSCULAR
  Filled 2017-07-01: qty 1

## 2017-07-01 MED ORDER — ORPHENADRINE CITRATE 30 MG/ML IJ SOLN
60.0000 mg | Freq: Once | INTRAMUSCULAR | Status: AC
  Administered 2017-07-01: 60 mg via INTRAMUSCULAR
  Filled 2017-07-01: qty 2

## 2017-07-01 MED ORDER — ORPHENADRINE CITRATE ER 100 MG PO TB12: 100.0000 mg | ORAL_TABLET | Freq: Two times a day (BID) | ORAL | 0 refills | Status: AC

## 2017-07-01 MED ORDER — DEXAMETHASONE SODIUM PHOSPHATE 10 MG/ML IJ SOLN
10.0000 mg | Freq: Once | INTRAMUSCULAR | Status: AC
  Administered 2017-07-01: 10 mg via INTRAMUSCULAR
  Filled 2017-07-01: qty 1

## 2017-07-01 NOTE — Discharge Instructions (Signed)
Take medication as prescribed.   Return to emergency department if symptoms significantly worsen.  Otherwise, follow-up with your primary care provider or neurology.

## 2017-07-01 NOTE — ED Notes (Signed)
Pt reports lower back pain that radiates down into her left buttock and left leg. Pt states that she had MRI done last Saturday that was ordered as out patient and that it did show some issues with her back, pt was referred to see someone for this but is unable to see them for 3 weeks.  Pt reports that she is leaving to go to [employer] and is needing something to help with the back pain until she comes back. Pt reports trouble walking and bending d/t pain. Pt has hx/o MS.

## 2017-07-01 NOTE — ED Triage Notes (Signed)
Pulled muscle to lower back about two weeks ago.  patient has been using heat and ice to help with pain.

## 2017-07-01 NOTE — ED Provider Notes (Signed)
[facility] Emergency Department Provider Note   ____________________________________________   I have reviewed the triage vital signs and the nursing notes.   HISTORY  Chief Complaint Back Pain    HPI Bailey Graham is a 56 y.o. female presents emergency department with left-sided low back pain with left side sciatic pain radiating to the foot.  Patient also notes muscle spasms in the left side gluten medius and left piriformis.  Patient denies any traumatic injury or fall, she noted onset of symptoms 2 weeks ago.  She has been attempting conservative treatment with heat therapy and cold therapy as well as pain medication she is prescribed for her multiple sclerosis.  None of the above management has improved symptoms.  Patient had similar symptoms in the past resolving with conservative measures however, this episode has not resolved.  Patient denies any changes in bowel or bladder and denies saddle anesthesia.  Patient recently had a lumbar MRI she was referred by Dr. Sherryll Burger at [facility] neurology. MRI noted disc extrusion at L3-4 contacting the left L4 nerve root and disc bulge and facet hypertrophy at L2-3 without foraminal stenosis or impingement.  Patient denies fever, chills, headache, vision changes, chest pain, chest tightness, shortness of breath, abdominal pain, nausea and vomiting.  Past Medical History:  Diagnosis Date  . Anemia   . Asthma without status asthmaticus   . Breast lump   . Cervicalgia   . Depression   . Fibrocystic breast disease   . Genital herpes   . Headache   . Herpetic whitlow   . Hyperlipidemia   . Hypertension   . Multiple sclerosis (HCC)   . Phlebitis   . Pulmonary emboli (HCC)   . Restless leg syndrome     Patient Active Problem List   Diagnosis Date Noted  . Multiple sclerosis (HCC) 04/24/2017  . Venous insufficiency of both lower extremities 01/11/2017  . Hyperlipidemia 01/05/2017  . Essential  hypertension 01/05/2017  . Pain in both lower extremities 01/05/2017    Past Surgical History:  Procedure Laterality Date  . ESOPHAGOGASTRODUODENOSCOPY (EGD) WITH PROPOFOL N/A 09/29/2015   Performed by Wallace Cullens, MD at [facility] ENDOSCOPY  . REDUCTION MAMMAPLASTY Bilateral 2002 and 2013    Prior to Admission medications   Medication Sig Start Date End Date Taking? Authorizing Provider  albuterol (PROVENTIL HFA;VENTOLIN HFA) 108 (90 Base) MCG/ACT inhaler Inhale 2 puffs into the lungs every 6 (six) hours as needed for wheezing or shortness of breath.    [provider]  atorvastatin (LIPITOR) 40 MG tablet Take 40 mg by mouth daily.    [provider]  clonazePAM (KLONOPIN) 2 MG tablet Take 1-2 mg by mouth every 12 (twelve) hours as needed for anxiety.    [provider]  cyclobenzaprine (FLEXERIL) 10 MG tablet Take 10 mg by mouth every 8 (eight) hours as needed for muscle spasms.    [provider]  fentaNYL (DURAGESIC - DOSED MCG/HR) 75 MCG/HR Place 75 mcg onto the skin every 3 (three) days.    [provider]  fluticasone (FLONASE) 50 MCG/ACT nasal spray Place 2 sprays into both nostrils daily.    [provider]  gabapentin (NEURONTIN) 300 MG capsule Take 300 mg by mouth daily.    [provider]  ibuprofen (ADVIL,MOTRIN) 200 MG tablet Take 800 mg by mouth as needed for mild pain or moderate pain.    [provider]  Linaclotide Karlene Einstein) 290 MCG CAPS capsule Take 290 mcg  by mouth daily.    [provider]  lurasidone (LATUDA) 20 MG TABS tablet Take 20 mg by mouth at bedtime.    [provider]  metoprolol succinate (TOPROL-XL) 50 MG 24 hr tablet Take 50 mg by mouth daily. Take with or immediately following a meal.    [provider]  montelukast (SINGULAIR) 10 MG tablet Take 10 mg by mouth at bedtime.    [provider]  naloxegol oxalate (MOVANTIK) 25 MG TABS tablet Take by mouth.  11/01/16   [provider]  orphenadrine (NORFLEX) 100 MG tablet Take 1 tablet (100 mg total) 2 (two) times daily by mouth. 07/01/17 07/01/18  Jia Dottavio M, PA-C  oxyCODONE (OXY IR/ROXICODONE) 5 MG immediate release tablet Take 5-10 mg by mouth every 8 (eight) hours as needed for severe pain.    [provider]  pantoprazole (PROTONIX) 40 MG tablet Take 40 mg by mouth daily.    [provider]  predniSONE (STERAPRED UNI-PAK 21 TAB) 10 MG (21) TBPK tablet Take 6 tablets on day 1. Take 5 tablets on day 2. Take 4 tablets on day 3. Take 3 tablets on day 4. Take 2 tablets on day 5. Take 1 tablets on day 6. 07/02/17   Dhilan Brauer M, PA-C  QUEtiapine (SEROQUEL) 100 MG tablet Take 2 to 3 tablets PO QHS 04/27/16   [provider]  rizatriptan (MAXALT-MLT) 10 MG disintegrating tablet Take 10 mg by mouth as needed for migraine. May repeat in 2 hours if needed    [provider]  SUMAtriptan (IMITREX) 100 MG tablet Take 100 mg by mouth every 2 (two) hours as needed for migraine. May repeat in 2 hours if headache persists or recurs.    [provider]  tiZANidine (ZANAFLEX) 4 MG tablet Take 4 mg by mouth every 6 (six) hours as needed for muscle spasms.    [provider]  traZODone (DESYREL) 150 MG tablet Take by mouth at bedtime.    [provider]  valACYclovir (VALTREX) 500 MG tablet Take 500 mg by mouth 2 (two) times daily.    [provider]  warfarin (COUMADIN) 5 MG tablet Take 5 mg by mouth daily.    [provider]  zolpidem (AMBIEN) 10 MG tablet Take 10 mg by mouth at bedtime as needed for sleep.    [provider]    Allergies Amoxicillin and Sulfa antibiotics  Family History  Problem Relation Age of Onset  . Breast cancer Neg Hx     Social History Social History   Tobacco Use  . Smoking status: Never Smoker  . Smokeless tobacco: Never Used  Substance Use Topics  . Alcohol use: No  . Drug  use: No    Review of Systems Constitutional: Negative for fever/chills Eyes: No visual changes. Cardiovascular: Denies chest pain. Respiratory: Denies shortness of breath. Genitourinary: Negative for dysuria. Musculoskeletal: Positive for left side lumbar back pain with radiating pain down the left lower extremity to the foot.  Gluteus medius and piriformis muscle spasms. Skin: Negative for rash. Neurological: Negative for headaches.  Negative focal weakness or numbness. Negative for loss of consciousness. Able to ambulate. ____________________________________________   PHYSICAL EXAM:  VITAL SIGNS: ED Triage Vitals  Enc Vitals Group     BP 07/01/17 1241 (!) 146/98     Pulse Rate 07/01/17 1241 91     Resp 07/01/17 1241 16     Temp 07/01/17 1241 97.9 F (36.6 C)  Temp Source 07/01/17 1241 Oral     SpO2 07/01/17 1241 98 %     Weight 07/01/17 1240 165 lb (74.8 kg)     Height 07/01/17 1240 5\' 3"  (1.6 m)     Head Circumference --      Peak Flow --      Pain Score 07/01/17 1240 7     Pain Loc --      Pain Edu? --      Excl. in GC? --     Constitutional: Alert and oriented. Well appearing and in no acute distress.  Eyes: Conjunctivae are normal. PERRL. EOMI  Head: Normocephalic and atraumatic. Neck:Supple.  Cardiovascular: Normal rate, regular rhythm. Good peripheral circulation. Respiratory: Normal respiratory effort without tachypnea or retractions. Lungs CTAB.  Gastrointestinal: Bowel sounds 4 quadrants. Soft and nontender to palpation. No distention. No CVA tenderness. Musculoskeletal: Lumbar spine range of motion all planes intact.  Negative lumbar spinous process tenderness, no deformities noted.  Palpable tenderness along the left gluteal medius and left piriformis with palpable trigger points.  Left lower extremity radiculopathy without sensation changes or significant motor weakness. Neurologic: Normal speech and language.  No sensory loss or abnormal reflexes.    Skin:  Skin is warm, dry and intact. No rash noted. Psychiatric: Mood and affect are normal. Speech and behavior are normal. Patient exhibits appropriate insight and judgement.  ____________________________________________   LABS (all labs ordered are listed, but only abnormal results are displayed)  Labs Reviewed - No data to display ____________________________________________  EKG none ____________________________________________  RADIOLOGY none ____________________________________________   PROCEDURES  Procedure(s) performed: Trigger point injections:  Performed by: Clois Comber Consent: Verbal consent obtained. Risks and benefits: risks, benefits and alternatives were discussed Kenalog 40 mg/ml and Lidocaine 2% 5 ml: 6 ml total  (2) trigger point injections along each side chosen based on palpation of trigger points. Left glut medius and left lateral piriformis.  Patient tolerance: Patient tolerated the procedure well with no immediate complications.    Critical Care performed: no ____________________________________________   INITIAL IMPRESSION / ASSESSMENT AND PLAN / ED COURSE  Pertinent labs & imaging results that were available during my care of the patient were reviewed by me and considered in my medical decision making (see chart for details).  Patient presents to emergency department with left-sided low back pain with left side sciatic pain radiating to the foot for 2 weeks without traumatic injury. History and physical exam findings are reassuring symptoms are consistent with lumbar back pain with left-sided sciatic symptoms and associated muscle spasms of the lumbar paraspinals, gluteal medius and piriformis.  Review of imaging revealed degenerative disc changes affecting the L3-4 level consistent with the above symptoms. Patient noted improvement of symptoms following Decadron, Norflex and trigger point injections given during the course of care in the  emergency department. Patient will be prescribed prednisone taper and Norflex as needed for muscle spasms. Patient advised to follow up with PCP as needed or return to the emergency department if symptoms return or worsen. Patient informed of clinical course, understand medical decision-making process, and agree with plan.  ____________________________________________   FINAL CLINICAL IMPRESSION(S) / ED DIAGNOSES  Final diagnoses:  Acute bilateral low back pain with left-sided sciatica  Spasm of muscle of lower back       NEW MEDICATIONS STARTED DURING THIS VISIT:  This SmartLink is deprecated. Use AVSMEDLIST instead to display the medication list for a patient.   Note:  This document was prepared using Dragon  voice recognition software and may include unintentional dictation errors.    Clois Comber, PA-C 07/01/17 1459    Jeanmarie Plant, MD 07/01/17 [phone]

## 2017-07-28 ENCOUNTER — Ambulatory Visit
Admission: RE | Admit: 2017-07-28 | Discharge: 2017-07-28 | Disposition: A | Discharge status: Home / Self Care | Payer: Medicare Other | Source: Ambulatory Visit | Attending: Family Medicine | Admitting: Family Medicine

## 2017-07-28 DIAGNOSIS — Z1231 Encounter for screening mammogram for malignant neoplasm of breast: Secondary | ICD-10-CM | POA: Insufficient documentation

## 2017-08-23 ENCOUNTER — Ambulatory Visit: Payer: Medicare Other | Attending: Neurology

## 2017-08-23 DIAGNOSIS — R0683 Snoring: Secondary | ICD-10-CM | POA: Diagnosis present

## 2017-08-23 DIAGNOSIS — G4733 Obstructive sleep apnea (adult) (pediatric): Secondary | ICD-10-CM | POA: Diagnosis not present

## 2017-09-07 ENCOUNTER — Ambulatory Visit: Payer: Medicare Other | Attending: Neurology

## 2017-09-07 DIAGNOSIS — G4733 Obstructive sleep apnea (adult) (pediatric): Secondary | ICD-10-CM | POA: Diagnosis not present

## 2017-09-07 DIAGNOSIS — R0683 Snoring: Secondary | ICD-10-CM | POA: Insufficient documentation

## 2018-01-27 ENCOUNTER — Other Ambulatory Visit (INDEPENDENT_AMBULATORY_CARE_PROVIDER_SITE_OTHER): Payer: Self-pay | Admitting: Vascular Surgery

## 2018-02-08 ENCOUNTER — Emergency Department
Admission: EM | Admit: 2018-02-08 | Discharge: 2018-02-08 | Disposition: A | Discharge status: Home / Self Care | Payer: Medicare Other | Attending: Student in an Organized Health Care Education/Training Program | Admitting: Student in an Organized Health Care Education/Training Program

## 2018-02-08 ENCOUNTER — Encounter: Payer: Self-pay | Admitting: Emergency Medicine

## 2018-02-08 ENCOUNTER — Other Ambulatory Visit: Payer: Self-pay

## 2018-02-08 DIAGNOSIS — I10 Essential (primary) hypertension: Secondary | ICD-10-CM | POA: Diagnosis not present

## 2018-02-08 DIAGNOSIS — Z79899 Other long term (current) drug therapy: Secondary | ICD-10-CM | POA: Diagnosis not present

## 2018-02-08 DIAGNOSIS — Y999 Unspecified external cause status: Secondary | ICD-10-CM | POA: Diagnosis not present

## 2018-02-08 DIAGNOSIS — X58XXXA Exposure to other specified factors, initial encounter: Secondary | ICD-10-CM | POA: Diagnosis not present

## 2018-02-08 DIAGNOSIS — J45909 Unspecified asthma, uncomplicated: Secondary | ICD-10-CM | POA: Diagnosis not present

## 2018-02-08 DIAGNOSIS — Y939 Activity, unspecified: Secondary | ICD-10-CM | POA: Diagnosis not present

## 2018-02-08 DIAGNOSIS — S46812A Strain of other muscles, fascia and tendons at shoulder and upper arm level, left arm, initial encounter: Secondary | ICD-10-CM | POA: Diagnosis not present

## 2018-02-08 DIAGNOSIS — Y929 Unspecified place or not applicable: Secondary | ICD-10-CM | POA: Diagnosis not present

## 2018-02-08 DIAGNOSIS — S4992XA Unspecified injury of left shoulder and upper arm, initial encounter: Secondary | ICD-10-CM | POA: Diagnosis present

## 2018-02-08 DIAGNOSIS — Z7901 Long term (current) use of anticoagulants: Secondary | ICD-10-CM | POA: Insufficient documentation

## 2018-02-08 MED ORDER — KETOROLAC TROMETHAMINE 10 MG PO TABS: 10.0000 mg | ORAL_TABLET | Freq: Three times a day (TID) | ORAL | 0 refills | Status: AC

## 2018-02-08 MED ORDER — KETOROLAC TROMETHAMINE 30 MG/ML IJ SOLN
30.0000 mg | Freq: Once | INTRAMUSCULAR | Status: AC
  Administered 2018-02-08: 30 mg via INTRAMUSCULAR
  Filled 2018-02-08: qty 1

## 2018-02-08 MED ORDER — TIZANIDINE HCL 4 MG PO TABS: 4.0000 mg | ORAL_TABLET | Freq: Three times a day (TID) | ORAL | 0 refills | Status: AC

## 2018-02-08 NOTE — ED Provider Notes (Signed)
[facility] Emergency Department Provider Note ____________________________________________  Time seen: 1104  I have reviewed the triage vital signs and the nursing notes.  HISTORY  Chief Complaint  Shoulder Pain  HPI Bailey Graham is a 57 y.o. female presents herself to the ED for evaluation of left trapezius muscle pain and spasm.  Patient describes a 10-day complaint of left shoulder pain and spasm.  She denies any known injury except carrying her dog in a sling over her neck.  She does recall an episode when she tried to remove the sling from overhead she had some immediate pain to the mid back at the base of the neck.  She denies any distal paresthesias, grip changes, or chest pain.  She was apparently seen by a massage therapist yesterday for deep tissue massage, but describes increased pain and bruising following a massage.  She presents now for further evaluation.  She is a patient of pain management, and takes oxycodone, Zanaflex, and gabapentin regularly.  Past Medical History:  Diagnosis Date  . Anemia   . Asthma without status asthmaticus   . Breast lump   . Cervicalgia   . Depression   . Fibrocystic breast disease   . Genital herpes   . Headache   . Herpetic whitlow   . Hyperlipidemia   . Hypertension   . Multiple sclerosis (HCC)   . Phlebitis   . Pulmonary emboli (HCC)   . Restless leg syndrome     Patient Active Problem List   Diagnosis Date Noted  . Multiple sclerosis (HCC) 04/24/2017  . Venous insufficiency of both lower extremities 01/11/2017  . Hyperlipidemia 01/05/2017  . Essential hypertension 01/05/2017  . Pain in both lower extremities 01/05/2017    Past Surgical History:  Procedure Laterality Date  . ESOPHAGOGASTRODUODENOSCOPY (EGD) WITH PROPOFOL N/A 09/29/2015   Procedure: ESOPHAGOGASTRODUODENOSCOPY (EGD) WITH PROPOFOL;  Surgeon: Wallace Cullens, MD;  Location: [facility] ENDOSCOPY;  Service: Gastroenterology;  Laterality: N/A;   . REDUCTION MAMMAPLASTY Bilateral 2002 and 2013    Prior to Admission medications   Medication Sig Start Date End Date Taking? Authorizing Provider  albuterol (PROVENTIL HFA;VENTOLIN HFA) 108 (90 Base) MCG/ACT inhaler Inhale 2 puffs into the lungs every 6 (six) hours as needed for wheezing or shortness of breath.    [provider]  atorvastatin (LIPITOR) 40 MG tablet Take 40 mg by mouth daily.    [provider]  clonazePAM (KLONOPIN) 2 MG tablet Take 1-2 mg by mouth every 12 (twelve) hours as needed for anxiety.    [provider]  cyclobenzaprine (FLEXERIL) 10 MG tablet Take 10 mg by mouth every 8 (eight) hours as needed for muscle spasms.    [provider]  fentaNYL (DURAGESIC - DOSED MCG/HR) 75 MCG/HR Place 75 mcg onto the skin every 3 (three) days.    [provider]  fluticasone (FLONASE) 50 MCG/ACT nasal spray Place 2 sprays into both nostrils daily.    [provider]  gabapentin (NEURONTIN) 300 MG capsule Take 300 mg by mouth daily.    [provider]  ibuprofen (ADVIL,MOTRIN) 200 MG tablet Take 800 mg by mouth as needed for mild pain or moderate pain.    [provider]  ketorolac (TORADOL) 10 MG tablet Take 1 tablet (10 mg total) by mouth every 8 (eight) hours. 02/08/18   Galileah Piggee, Charlesetta Ivory, PA-C  Linaclotide (LINZESS) 290 MCG CAPS capsule Take 290 mcg by mouth daily.    [provider]  lurasidone (LATUDA) 20 MG TABS tablet Take 20 mg by mouth at bedtime.    [provider]  metoprolol succinate (TOPROL-XL) 50 MG 24 hr tablet Take 50 mg by mouth daily. Take with or immediately following a meal.    [provider]  montelukast (SINGULAIR) 10 MG tablet Take 10 mg by mouth at bedtime.    [provider]  naloxegol oxalate (MOVANTIK) 25 MG TABS tablet Take by mouth. 11/01/16   [provider]  orphenadrine (NORFLEX) 100 MG tablet Take 1 tablet (100 mg total) 2 (two)  times daily by mouth. 07/01/17 07/01/18  Little, Traci M, PA-C  oxyCODONE (OXY IR/ROXICODONE) 5 MG immediate release tablet Take 5-10 mg by mouth every 8 (eight) hours as needed for severe pain.    [provider]  pantoprazole (PROTONIX) 40 MG tablet Take 40 mg by mouth daily.    [provider]  QUEtiapine (SEROQUEL) 100 MG tablet Take 2 to 3 tablets PO QHS 04/27/16   [provider]  rizatriptan (MAXALT-MLT) 10 MG disintegrating tablet Take 10 mg by mouth as needed for migraine. May repeat in 2 hours if needed    [provider]  SUMAtriptan (IMITREX) 100 MG tablet Take 100 mg by mouth every 2 (two) hours as needed for migraine. May repeat in 2 hours if headache persists or recurs.    [provider]  tiZANidine (ZANAFLEX) 4 MG tablet Take 4 mg by mouth every 6 (six) hours as needed for muscle spasms.    [provider]  tiZANidine (ZANAFLEX) 4 MG tablet Take 1 tablet (4 mg total) by mouth 3 (three) times daily for 10 days. 02/08/18 02/18/18  Draedyn Weidinger, Charlesetta Ivory, PA-C  traZODone (DESYREL) 150 MG tablet Take by mouth at bedtime.    [provider]  valACYclovir (VALTREX) 500 MG tablet Take 500 mg by mouth 2 (two) times daily.    [provider]  warfarin (COUMADIN) 5 MG tablet Take 5 mg by mouth daily.    [provider]  zolpidem (AMBIEN) 10 MG tablet Take 10 mg by mouth at bedtime as needed for sleep.    [provider]    Allergies Amoxicillin and Sulfa antibiotics  Family History  Problem Relation Age of Onset  . Breast cancer Neg Hx     Social History Social History   Tobacco Use  . Smoking status: Never Smoker  . Smokeless tobacco: Never Used  Substance Use Topics  . Alcohol use: No  . Drug use: No    Review of Systems  Constitutional: Negative for fever. Eyes: Negative for visual changes. ENT: Negative for sore throat. Cardiovascular: Negative for chest pain. Respiratory: Negative  for shortness of breath. Musculoskeletal: Positive for upper back pain. Skin: Negative for rash. Neurological: Negative for headaches, focal weakness or numbness. ____________________________________________  PHYSICAL EXAM:  VITAL SIGNS: ED Triage Vitals  Enc Vitals Group     BP 02/08/18 1012 (!) 142/80     Pulse Rate 02/08/18 1012 82     Resp 02/08/18 1012 16     Temp 02/08/18 1012 98.8 F (37.1 C)     Temp Source 02/08/18 1012 Oral     SpO2 02/08/18 1012 97 %     Weight 02/08/18 1013 154 lb (69.9 kg)     Height 02/08/18 1013 5\' 3"  (1.6 m)     Head Circumference --      Peak Flow --      Pain Score 02/08/18 1017  9     Pain Loc --      Pain Edu? --      Excl. in GC? --     Constitutional: Alert and oriented. Well appearing and in no distress. Head: Normocephalic and atraumatic. Neck: Supple. Normal range of motion without crepitus. Cardiovascular: Normal rate, regular rhythm. Normal distal pulses. Respiratory: Normal respiratory effort. No wheezes/rales/rhonchi. Gastrointestinal: Soft and nontender. No distention. Musculoskeletal: Nontender with normal range of motion in all extremities.  Neurologic: Cranial nerves II through XII grossly intact.  Normal UE DTRs bilaterally.  Normal composite fist noted.  Normal gait without ataxia. Normal speech and language. No gross focal neurologic deficits are appreciated. Skin:  Skin is warm, dry and intact. No rash noted. ____________________________________________  PROCEDURES  Procedures Ketorolac 30 mg IM ____________________________________________  INITIAL IMPRESSION / ASSESSMENT AND PLAN / ED COURSE  Patient with ED evaluation of sudden left trapezius muscle spasm.  Patient exam is overall benign.  No acute neuromuscular deficit is appreciated.  Patient's exam likely represent an acute trapezius muscle strain.  She will be discharged with a prescription for her Zanaflex to dose as directed.  She is also given Toradol oral  tabs to dose as directed.  She will follow with primary provider for ongoing symptoms.  Return precautions have been reviewed. ____________________________________________  FINAL CLINICAL IMPRESSION(S) / ED DIAGNOSES  Final diagnoses:  Trapezius muscle strain, left, initial encounter      Lissa Hoard, PA-C 02/08/18 1910    Willy Eddy, MD 02/09/18 [phone]

## 2018-02-08 NOTE — ED Triage Notes (Signed)
Pt states left shoulder pain for about 10 days now, denies injury to area, swelling noted on left neck, pt states she went for a deep tissue massage yesterday for some relief, bruising noted to left arm, states the massage made it worse.

## 2018-02-08 NOTE — Discharge Instructions (Addendum)
Your exam is consistent with a thoracic muscle strain. You appear to have aggravated the trapezius muscle on the left. Take the Toradol as directed, and the Zanaflex as needed. Follow-up with your provider for ongoing symptoms.

## 2018-02-08 NOTE — ED Notes (Signed)
See triage note  Presents with pain to neck and posterior left shoulder  States discomfort is moving into left arm  States pain started about 10 days ago   No deformity  noted

## 2018-02-24 ENCOUNTER — Other Ambulatory Visit: Payer: Self-pay | Admitting: Physical Medicine and Rehabilitation

## 2018-02-24 DIAGNOSIS — M5412 Radiculopathy, cervical region: Secondary | ICD-10-CM

## 2018-03-14 ENCOUNTER — Ambulatory Visit
Admission: RE | Admit: 2018-03-14 | Discharge: 2018-03-14 | Disposition: A | Discharge status: Home / Self Care | Payer: Medicare Other | Source: Ambulatory Visit | Attending: Physical Medicine and Rehabilitation | Admitting: Physical Medicine and Rehabilitation

## 2018-03-14 DIAGNOSIS — M5412 Radiculopathy, cervical region: Secondary | ICD-10-CM | POA: Diagnosis present

## 2018-03-14 DIAGNOSIS — M50122 Cervical disc disorder at C5-C6 level with radiculopathy: Secondary | ICD-10-CM | POA: Diagnosis not present

## 2018-03-14 DIAGNOSIS — M1288 Other specific arthropathies, not elsewhere classified, other specified site: Secondary | ICD-10-CM | POA: Diagnosis not present

## 2018-06-20 ENCOUNTER — Other Ambulatory Visit
Admission: RE | Admit: 2018-06-20 | Discharge: 2018-06-20 | Disposition: A | Discharge status: Home / Self Care | Payer: Medicare Other | Source: Ambulatory Visit | Attending: Family Medicine | Admitting: Family Medicine

## 2018-06-20 DIAGNOSIS — M1711 Unilateral primary osteoarthritis, right knee: Secondary | ICD-10-CM | POA: Insufficient documentation

## 2018-06-20 DIAGNOSIS — M25461 Effusion, right knee: Secondary | ICD-10-CM | POA: Insufficient documentation

## 2018-06-20 LAB — SYNOVIAL CELL COUNT + DIFF, W/ CRYSTALS
CRYSTALS FLUID: NONE SEEN
Eosinophils-Synovial: 0 %
Lymphocytes-Synovial Fld: 22 %
Monocyte-Macrophage-Synovial Fluid: 29 %
NEUTROPHIL, SYNOVIAL: 49 %
OTHER CELLS-SYN: 0
WBC, Synovial: 679 /mm3 — ABNORMAL HIGH (ref 0–200)

## 2018-06-26 ENCOUNTER — Other Ambulatory Visit: Payer: Self-pay | Admitting: Student

## 2018-06-26 DIAGNOSIS — G8929 Other chronic pain: Secondary | ICD-10-CM

## 2018-06-26 DIAGNOSIS — M25561 Pain in right knee: Principal | ICD-10-CM

## 2018-07-20 ENCOUNTER — Ambulatory Visit
Admission: RE | Admit: 2018-07-20 | Discharge: 2018-07-20 | Disposition: A | Discharge status: Home / Self Care | Payer: Medicare Other | Source: Ambulatory Visit | Attending: Student | Admitting: Student

## 2018-07-20 DIAGNOSIS — G8929 Other chronic pain: Secondary | ICD-10-CM

## 2018-07-20 DIAGNOSIS — M1711 Unilateral primary osteoarthritis, right knee: Secondary | ICD-10-CM | POA: Insufficient documentation

## 2018-07-20 DIAGNOSIS — R609 Edema, unspecified: Secondary | ICD-10-CM | POA: Insufficient documentation

## 2018-07-20 DIAGNOSIS — S83281A Other tear of lateral meniscus, current injury, right knee, initial encounter: Secondary | ICD-10-CM | POA: Insufficient documentation

## 2018-07-20 DIAGNOSIS — M25561 Pain in right knee: Secondary | ICD-10-CM

## 2018-09-18 ENCOUNTER — Other Ambulatory Visit: Payer: Self-pay | Admitting: Neurology

## 2018-09-18 ENCOUNTER — Other Ambulatory Visit (HOSPITAL_COMMUNITY): Payer: Self-pay | Admitting: Neurology

## 2018-09-18 DIAGNOSIS — G43719 Chronic migraine without aura, intractable, without status migrainosus: Secondary | ICD-10-CM

## 2018-09-21 ENCOUNTER — Ambulatory Visit: Payer: Medicare Other

## 2019-08-27 IMAGING — MR MR LUMBAR SPINE W/O CM
5 series · 34 of 48 positions shown · non-contrast
Comparison: Prior MRI from 02/16/2012.

CLINICAL DATA: Initial evaluation for chronic low back pain
radiating down left leg with numbness.

EXAM:
MRI LUMBAR SPINE WITHOUT CONTRAST
TECHNIQUE: Multiplanar, multisequence MR imaging of the lumbar spine was
performed. No intravenous contrast was administered.

[Series 2: T2 · sagittal · 4.0mm · 0.81mm/px · 6 of 15 slices shown (1 of 2)]
[im 1/15]
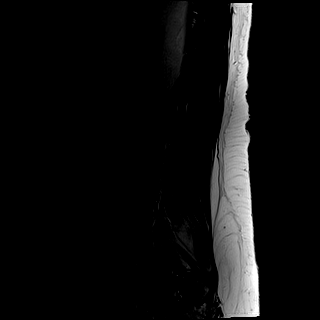
[im 3/15]
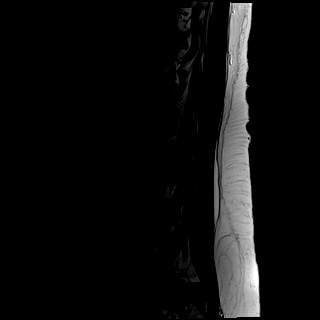
[im 6/15]
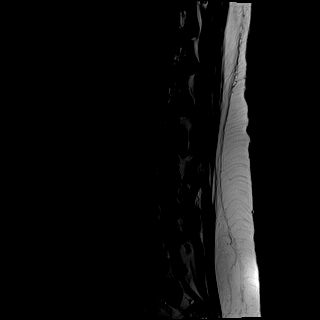
[im 9/15]
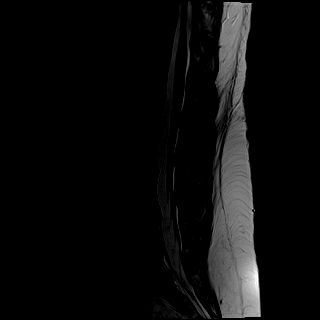
[im 12/15]
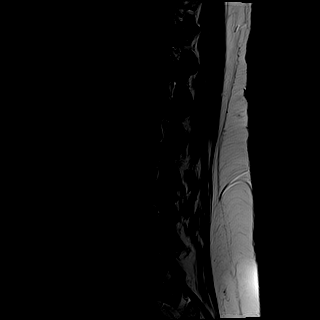
[im 15/15]
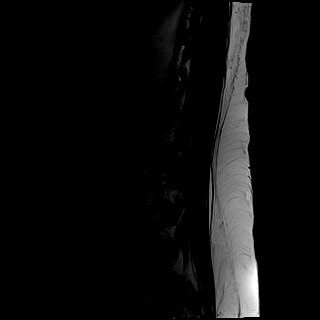

[Series 3: T1 · sagittal · 4.0mm · 0.81mm/px · 6 of 15 slices shown (1 of 2)]
[im 1/15]
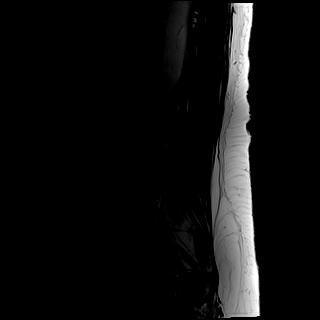
[im 3/15]
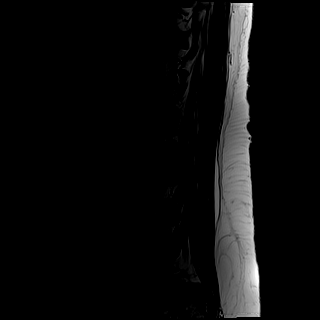
[im 6/15]
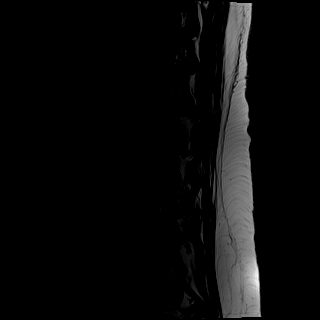
[im 9/15]
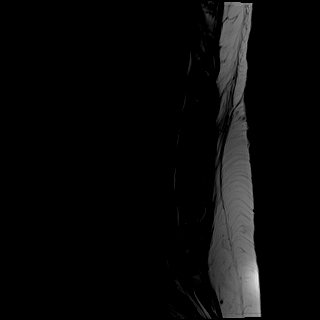
[im 12/15]
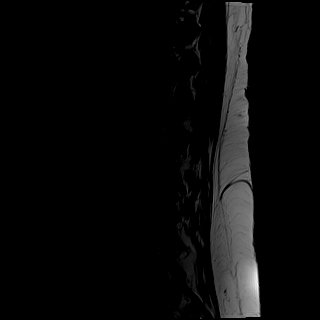
[im 15/15]
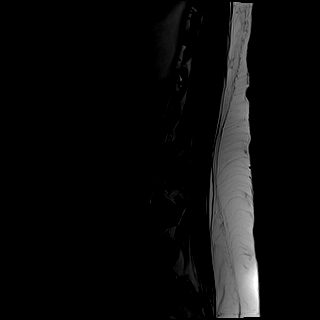

[Series 4: STIR · sagittal · 4.0mm · 1.02mm/px · 4 of 15 slices shown]
[im 1/15]
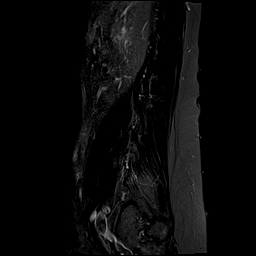
[im 3/15]
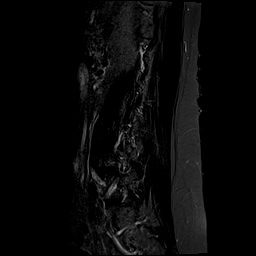
[im 6/15]
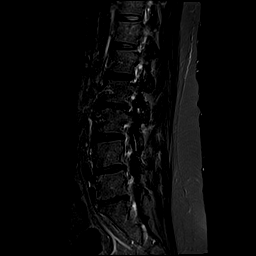
[im 9/15]
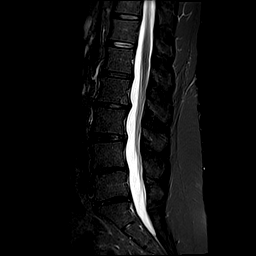

[Series 5: T2 · axial · 4.0mm · 0.78mm/px · z∈[-49,+146]mm · 9 of 37 slices shown (2 of 2)]
[im 1/37]
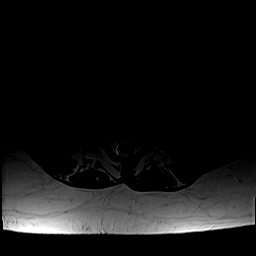
[im 6/37]
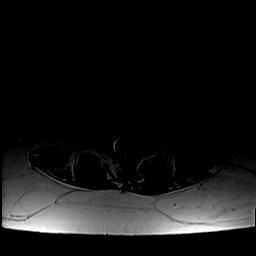
[im 11/37]
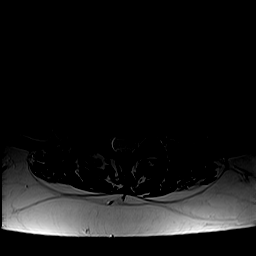
[im 16/37]
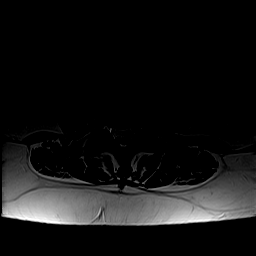
[im 19/37]
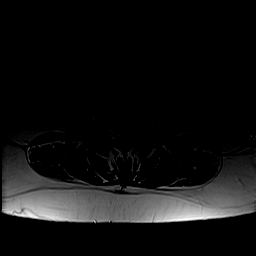
[im 21/37]
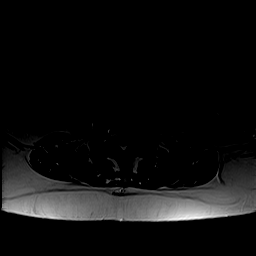
[im 26/37]
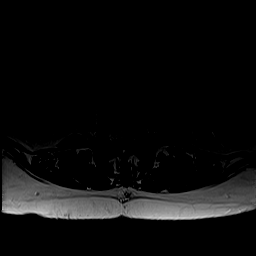
[im 31/37]
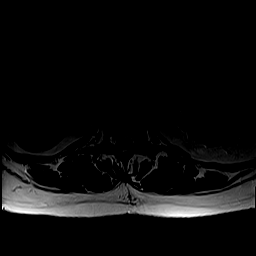
[im 37/37]
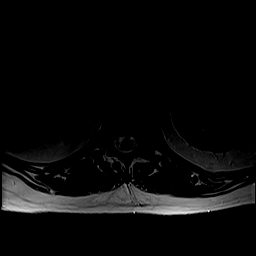

[Series 6: T1 · axial · 4.0mm · 0.39mm/px · z∈[-49,+146]mm · 9 of 37 slices shown (2 of 2)]
[im 1/37]
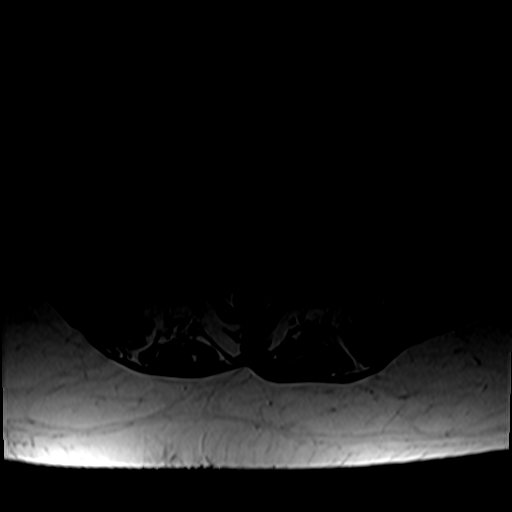
[im 6/37]
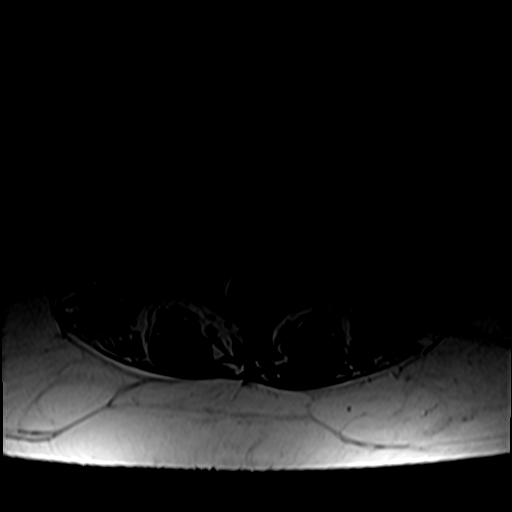
[im 11/37]
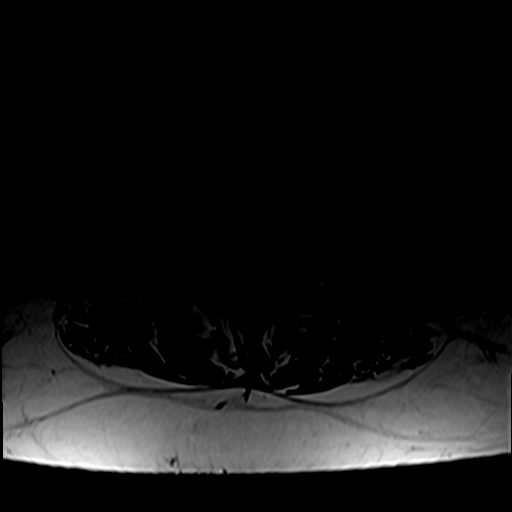
[im 16/37]
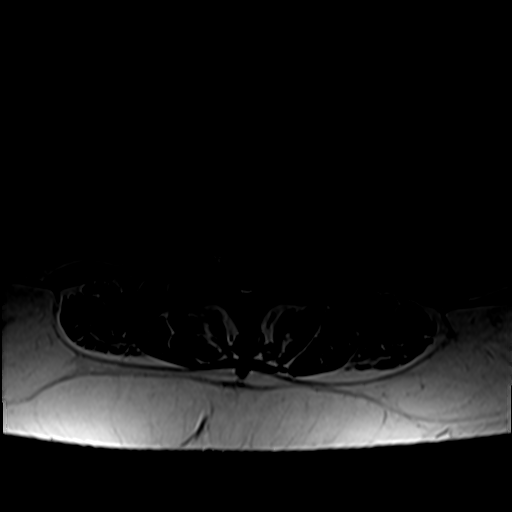
[im 19/37]
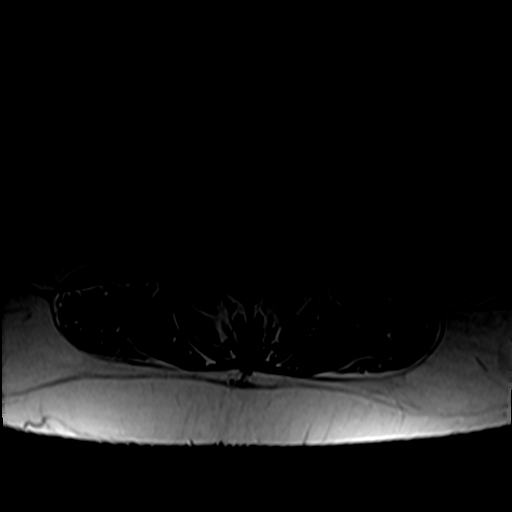
[im 21/37]
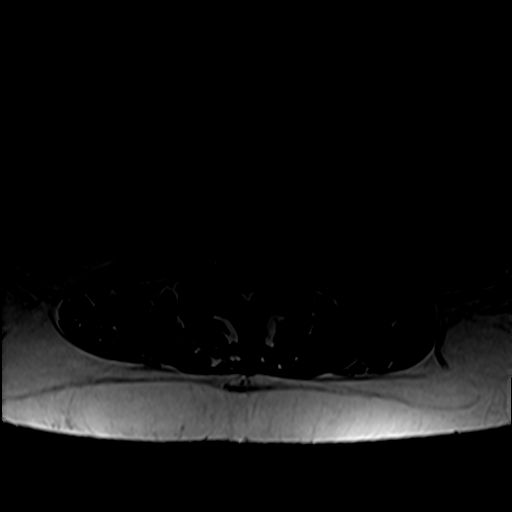
[im 26/37]
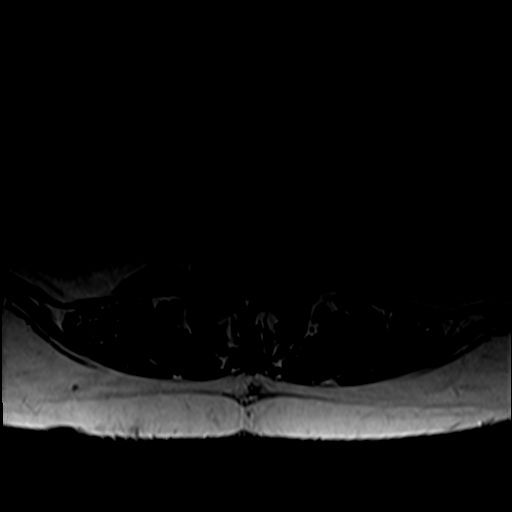
[im 31/37]
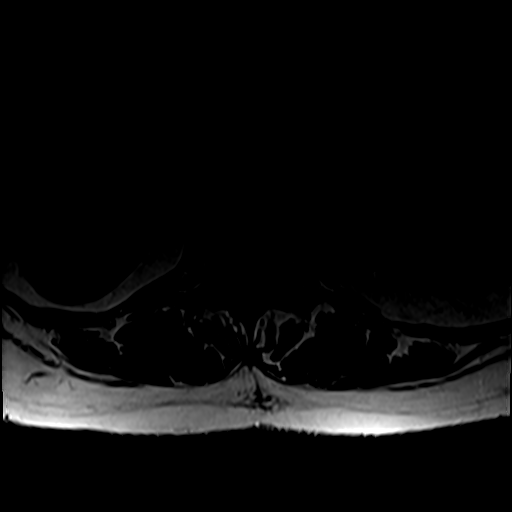
[im 37/37]
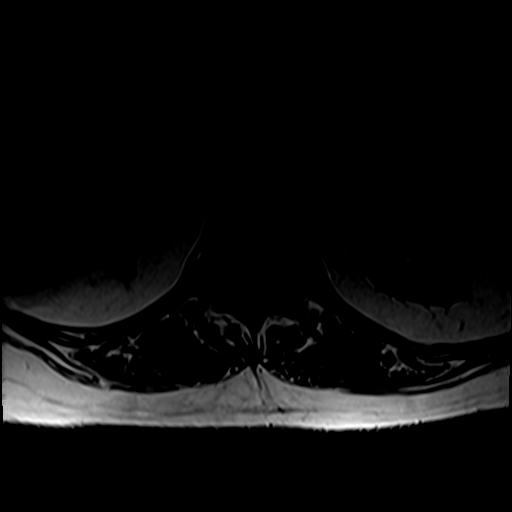

[34 of 48 positions shown; findings below may reference images not displayed]

FINDINGS: Segmentation: Normal segmentation. Lowest well-formed disc labeled
the L5-S1 level.

Alignment: Trace retrolisthesis of L5 on S1. Trace anterolisthesis
of L3 on L4. Straightening of the normal lumbar lordosis.

Vertebrae: Vertebral body heights maintained. No evidence for acute
or chronic fracture. Endplate Schmorl's nodes noted about the L2-3
interspace. Bone marrow signal intensity within normal limits. No
discrete or worrisome osseous lesion. No abnormal marrow edema.

Conus medullaris: Extends to the L2 level and appears normal.

Paraspinal and other soft tissues: Paraspinous soft tissues within
normal limits. Visualized visceral structures are normal.

Disc levels:

L1-2: Mild annular disc bulge with disc desiccation. Superimposed
small right foraminal/extraforaminal disc protrusion, closely
approximating the exiting right L1 nerve root without frank
impingement (series 5, image 10). No canal or foraminal stenosis.

L2-3: Mild diffuse disc bulge with disc desiccation and
intervertebral disc space narrowing. Disc bulging slightly eccentric
to the left without focal disc protrusion. Previously seen disc
extrusion at this level has largely regressed. Mild left-sided facet
hypertrophy. Mild left lateral recess and foraminal narrowing
without impingement.

L3-4: Trace anterolisthesis. Shallow central disc protrusion indents
the ventral thecal sac, slightly eccentric to the right. There is a
superimposed tiny left subarticular disc extrusion with inferior
migration, contacting the descending left L4 nerve root in the left
lateral recess (series 5, image 23). Mild facet hypertrophy. No
significant canal or foraminal stenosis.

L4-5: Chronic intervertebral disc space narrowing with mild diffuse
disc bulge. Moderate right with mild left facet hypertrophy. No
significant canal stenosis. Foramina remain patent. Appearance
relatively stable from previous.

L5-S1: Chronic intervertebral disc space narrowing with mild diffuse
disc bulge and disc desiccation. Right paracentral annular fissure.
Bulging disc closely approximates the descending S1 nerve roots
without neural impingement. No canal or foraminal stenosis.
Appearance is stable from previous.
IMPRESSION: 1. Tiny left subarticular disc extrusion at L3-4, contacting the
descending left L4 nerve root in the left lateral recess.
2. Left eccentric disc bulge and facet hypertrophy at L2-3 with
resultant mild left lateral recess and foraminal stenosis without
impingement.
